# Patient Record
Sex: Female | Born: 1970
Health system: Southern US, Community
[De-identification: ages and names within clinical notes are randomized; demographics above are authoritative.]

## PROBLEM LIST (undated history)

## (undated) DIAGNOSIS — K649 Unspecified hemorrhoids: Secondary | ICD-10-CM

## (undated) DIAGNOSIS — Z5189 Encounter for other specified aftercare: Secondary | ICD-10-CM

## (undated) DIAGNOSIS — E7800 Pure hypercholesterolemia, unspecified: Secondary | ICD-10-CM

## (undated) DIAGNOSIS — F419 Anxiety disorder, unspecified: Secondary | ICD-10-CM

## (undated) DIAGNOSIS — D649 Anemia, unspecified: Secondary | ICD-10-CM

## (undated) HISTORY — DX: Unspecified hemorrhoids: K64.9

## (undated) HISTORY — DX: Pure hypercholesterolemia, unspecified: E78.00

## (undated) HISTORY — DX: Anemia, unspecified: D64.9

## (undated) HISTORY — DX: Anxiety disorder, unspecified: F41.9

## (undated) HISTORY — PX: OTHER SURGICAL HISTORY: SHX169

## (undated) HISTORY — PX: ABDOMINAL HYSTERECTOMY: SHX81

---

## 2010-11-24 ENCOUNTER — Inpatient Hospital Stay (HOSPITAL_COMMUNITY)
Admission: EM | Admit: 2010-11-24 | Discharge: 2010-11-26 | Payer: Self-pay | Source: Home / Self Care | Attending: Internal Medicine | Admitting: Internal Medicine

## 2010-11-25 ENCOUNTER — Encounter: Payer: Self-pay | Admitting: Internal Medicine

## 2010-11-28 LAB — PREGNANCY, URINE: Preg Test, Ur: NEGATIVE

## 2010-11-28 LAB — DIFFERENTIAL
Basophils Absolute: 0.1 10*3/uL (ref 0.0–0.1)
Basophils Relative: 2 % — ABNORMAL HIGH (ref 0–1)
Eosinophils Absolute: 0 10*3/uL (ref 0.0–0.7)
Lymphocytes Relative: 28 % (ref 12–46)
Neutrophils Relative %: 64 % (ref 43–77)

## 2010-11-28 LAB — CBC
HCT: 27.4 % — ABNORMAL LOW (ref 36.0–46.0)
HCT: 31.2 % — ABNORMAL LOW (ref 36.0–46.0)
Hemoglobin: 5.6 g/dL — CL (ref 12.0–15.0)
Hemoglobin: 6.4 g/dL — CL (ref 12.0–15.0)
Hemoglobin: 9.2 g/dL — ABNORMAL LOW (ref 12.0–15.0)
MCH: 16.5 pg — ABNORMAL LOW (ref 26.0–34.0)
MCH: 16.3 pg — ABNORMAL LOW (ref 26.0–34.0)
MCH: 20.4 pg — ABNORMAL LOW (ref 26.0–34.0)
MCV: 67.3 fL — ABNORMAL LOW (ref 78.0–100.0)
MCV: 69 fL — ABNORMAL LOW (ref 78.0–100.0)
Platelets: 129 10*3/uL — ABNORMAL LOW (ref 150–400)
Platelets: 126 10*3/uL — ABNORMAL LOW (ref 150–400)
RBC: 3.4 MIL/uL — ABNORMAL LOW (ref 3.87–5.11)
RBC: 3.92 MIL/uL (ref 3.87–5.11)
RBC: 4.52 MIL/uL (ref 3.87–5.11)
RDW: 25.2 % — ABNORMAL HIGH (ref 11.5–15.5)
WBC: 7.1 10*3/uL (ref 4.0–10.5)
WBC: 7.3 10*3/uL (ref 4.0–10.5)
WBC: 6.2 10*3/uL (ref 4.0–10.5)

## 2010-11-28 LAB — COMPREHENSIVE METABOLIC PANEL
ALT: 9 U/L (ref 0–35)
AST: 16 U/L (ref 0–37)
Albumin: 4.4 g/dL (ref 3.5–5.2)
Alkaline Phosphatase: 53 U/L (ref 39–117)
BUN: 4 mg/dL — ABNORMAL LOW (ref 6–23)
Chloride: 108 mEq/L (ref 96–112)
Potassium: 3.6 mEq/L (ref 3.5–5.1)
Sodium: 138 mEq/L (ref 135–145)
Total Bilirubin: 0.8 mg/dL (ref 0.3–1.2)
Total Protein: 7.2 g/dL (ref 6.0–8.3)

## 2010-11-28 LAB — URINALYSIS, MICROSCOPIC ONLY
Bilirubin Urine: NEGATIVE
Hgb urine dipstick: NEGATIVE
Ketones, ur: NEGATIVE mg/dL
Nitrite: NEGATIVE
Protein, ur: NEGATIVE mg/dL
Specific Gravity, Urine: 1.007 (ref 1.005–1.030)
Urobilinogen, UA: 0.2 mg/dL (ref 0.0–1.0)

## 2010-11-28 LAB — FERRITIN: Ferritin: 2 ng/mL — ABNORMAL LOW (ref 10–291)

## 2010-11-28 LAB — BASIC METABOLIC PANEL
BUN: <1 mg/dL — ABNORMAL LOW (ref 6–23)
CO2: 21 mEq/L (ref 19–32)
CO2: 18 mEq/L — ABNORMAL LOW (ref 19–32)
Chloride: 110 mEq/L (ref 96–112)
Chloride: 110 mEq/L (ref 96–112)
Creatinine, Ser: 0.57 mg/dL (ref 0.4–1.2)
GFR calc Af Amer: >60 mL/min (ref >60)
Glucose, Bld: 84 mg/dL (ref 70–99)
Potassium: 3.6 mEq/L (ref 3.5–5.1)
Sodium: 138 mEq/L (ref 135–145)

## 2010-11-28 LAB — HEMOGLOBIN AND HEMATOCRIT, BLOOD
HCT: 22 % — ABNORMAL LOW (ref 36.0–46.0)
Hemoglobin: 5.7 g/dL — CL (ref 12.0–15.0)

## 2010-11-28 LAB — POCT I-STAT, CHEM 8
Chloride: 109 mEq/L (ref 96–112)
Glucose, Bld: 85 mg/dL (ref 70–99)
HCT: 26 % — ABNORMAL LOW (ref 36.0–46.0)
Potassium: 4.1 mEq/L (ref 3.5–5.1)
Sodium: 140 mEq/L (ref 135–145)

## 2010-11-28 LAB — TSH: TSH: 1.923 u[IU]/mL (ref 0.350–4.500)

## 2010-11-28 LAB — CK TOTAL AND CKMB (NOT AT ARMC)
CK, MB: 0.8 ng/mL (ref 0.3–4.0)
Relative Index: INVALID (ref 0.0–2.5)

## 2010-11-28 LAB — FOLATE: Folate: 19.4 ng/mL

## 2010-11-28 LAB — SAVE SMEAR

## 2010-11-28 LAB — OCCULT BLOOD, POC DEVICE: Fecal Occult Bld: POSITIVE

## 2010-11-28 LAB — TROPONIN I: Troponin I: 0.03 ng/mL (ref 0.00–0.06)

## 2010-11-28 LAB — IRON AND TIBC: UIBC: 390 ug/dL

## 2010-11-28 LAB — POCT CARDIAC MARKERS: CKMB, poc: <1 ng/mL — ABNORMAL LOW (ref 1.0–8.0)

## 2010-11-28 LAB — VITAMIN B12: Vitamin B-12: 453 pg/mL (ref 211–911)

## 2010-11-28 LAB — APTT: aPTT: 30 seconds (ref 24–37)

## 2010-11-29 LAB — CROSSMATCH
ABO/RH(D): A POS
Antibody Screen: NEGATIVE
Unit division: 0
Unit division: 0

## 2010-11-29 LAB — BASIC METABOLIC PANEL
BUN: 4 mg/dL — ABNORMAL LOW (ref 6–23)
CO2: 21 mEq/L (ref 19–32)
Calcium: 9 mg/dL (ref 8.4–10.5)
Creatinine, Ser: 0.59 mg/dL (ref 0.4–1.2)
Glucose, Bld: 81 mg/dL (ref 70–99)

## 2010-11-29 LAB — CBC
MCH: 20.1 pg — ABNORMAL LOW (ref 26.0–34.0)
MCHC: 28.6 g/dL — ABNORMAL LOW (ref 30.0–36.0)
MCHC: 29.1 g/dL — ABNORMAL LOW (ref 30.0–36.0)
MCV: 70.2 fL — ABNORMAL LOW (ref 78.0–100.0)
Platelets: 114 10*3/uL — ABNORMAL LOW (ref 150–400)
RDW: 26.5 % — ABNORMAL HIGH (ref 11.5–15.5)
WBC: 8.2 10*3/uL (ref 4.0–10.5)
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
## 2010-12-08 NOTE — Procedures (Signed)
Summary: Upper Endoscopy  Patient: [name] Note: All result statuses are Final unless otherwise noted.  Tests: (1) Upper Endoscopy (EGD)   EGD Upper Endoscopy       DONE     [facility]     [address]     [place], [place]  [address]           ENDOSCOPY PROCEDURE REPORT           PATIENT:  [name], [name]  MR#:  [id number]     BIRTHDATE:  1970-12-08, 39 yrs. old  GENDER:  female           ENDOSCOPIST:  Wilhemina Bonito. Eda Keys, MD     Referred by:  [facility],           PROCEDURE DATE:  11/25/2010     PROCEDURE:  EGD, diagnostic 43235     ASA CLASS:  Class I     INDICATIONS:  iron deficiency anemia           MEDICATIONS:   There was residual sedation effect present from     prior procedure., Fentanyl 25 mcg IV, Versed 3 mg IV     TOPICAL ANESTHETIC:  Cetacaine Spray           DESCRIPTION OF PROCEDURE:   After the risks benefits and     alternatives of the procedure were thoroughly explained, informed     consent was obtained.  The Pentax Gastroscope [id number] endoscope     was introduced through the mouth and advanced to the third portion     of the duodenum, without limitations.  The instrument was slowly     withdrawn as the mucosa was fully examined.     <<PROCEDUREIMAGES>>           A benign Schatzki's ring was found in the distal esophagus.     Otherwise normal esophagus.  The stomach was entered and closely     examined. The antrum, angularis, and lesser curvature were well     visualized, including a retroflexed view of the cardia and fundus.     The stomach wall was normally distensable. The scope passed easily     through the pylorus into the duodenum.  The duodenal bulb was     normal in appearance, as was the postbulbar duodenum.     Retroflexed views revealed no abnormalities.    The scope was then     withdrawn from the patient and the procedure completed.           COMPLICATIONS:  None           ENDOSCOPIC IMPRESSION:     1)  Schatzki's ring     2) Otherwise normal esophagus     3) Normal stomach     4) Normal duodenum     5) No GI cause for anemia found. Suspect chronic losses from     menstration           RECOMMENDATIONS:     1) Return to the care of the hospitalists     2) Recommend oral iron therapy chronically. You should arrange     follow up for her so that her blood counts can be monitored.     3) GI WILL SIGN OFF     4) discussed with husband and reports given           ______________________________     Wilhemina Bonito. Eda Keys,  MD           CC:  Ramiro Harvest, MD;  The Patient           n.     eSIGNED:   Wilhemina Bonito. Eda Keys at 11/25/2010 01:44 PM           Nira Retort, [id number]  Note: An exclamation mark (!) indicates a result that was not dispersed into the flowsheet. Document Creation Date: 11/25/2010 1:44 PM _______________________________________________________________________  (1) Order result status: Final Collection or observation date-time: 11/25/2010 13:25 Requested date-time:  Receipt date-time:  Reported date-time:  Referring Physician:   Ordering Physician: Fransico Setters [phone]) Specimen Source:  Source: [name] Order Number: [id number] Lab site:

## 2010-12-08 NOTE — Procedures (Signed)
Summary: Colonoscopy  Patient: [name] Note: All result statuses are Final unless otherwise noted.  Tests: (1) Colonoscopy (COL)   COL Colonoscopy           DONE     [facility]     [address]     [place], [place]  [address]           COLONOSCOPY PROCEDURE REPORT           PATIENT:  [name], [name]  MR#:  [id number]     BIRTHDATE:  May 04, 1971, 39 yrs. old  GENDER:  female     ENDOSCOPIST:  Wilhemina Bonito. Eda Keys, MD     REF. BY:  [facility],     PROCEDURE DATE:  11/25/2010     PROCEDURE:  Diagnostic Colonoscopy     ASA CLASS:  Class I     INDICATIONS:  Iron deficiency anemia, rectal bleeding     MEDICATIONS:   Fentanyl 125 mcg IV, Versed 12 mg, Benadryl 50 mg     IV           DESCRIPTION OF PROCEDURE:   After the risks benefits and     alternatives of the procedure were thoroughly explained, informed     consent was obtained.  Digital rectal exam was performed and     revealed an inflammed prolasped hemorrhoid.   The Pentax     Colonoscope [id number] endoscope was introduced through the anus and     advanced to the cecum, which was identified by both the appendix     and ileocecal valve, without limitations.  The quality of the prep     was excellent, using MoviPrep.  The instrument was then slowly     withdrawn as the colon was fully examined.     <<PROCEDUREIMAGES>>           FINDINGS:  The terminal ileum appeared normal.  A normal appearing     cecum, ileocecal valve, and appendiceal orifice were identified.     The ascending, hepatic flexure, transverse, splenic flexure,     descending, sigmoid colon, and rectum appeared unremarkable.     Retroflexed views in the rectum revealed internal hemorrhoids.     The scope was then withdrawn from the patient and the procedure     completed.           COMPLICATIONS:  None     ENDOSCOPIC IMPRESSION:     1) Normal terminal ileum     2) Normal colon     3) Internal hemorrhoids     RECOMMENDATIONS:   1) You can prescribe Anusol HC supp. and sitz baths for     hemorrhoids     2) You can refer to generl surgeon if hemorrhoid does not respond     to medical therapy     3) Upper endoscopy will be performed today to further evaluate     anemia     ______________________________     Wilhemina Bonito. Eda Keys, MD           CC:  Ramiro Harvest, MD;  The Patient           n.     eSIGNED:   Wilhemina Bonito. Eda Keys at 11/25/2010 01:20 PM           Nira Retort, [id number]  Note: An exclamation mark (!) indicates a result that was not dispersed into the flowsheet. Document Creation Date: 11/25/2010 5:19  PM _______________________________________________________________________  (1) Order result status: Final Collection or observation date-time: 11/25/2010 13:10 Requested date-time:  Receipt date-time:  Reported date-time:  Referring Physician:   Ordering Physician: Fransico Setters [phone]) Specimen Source:  Source: [name] Order Number: [id number] Lab site:

## 2011-01-11 ENCOUNTER — Ambulatory Visit (INDEPENDENT_AMBULATORY_CARE_PROVIDER_SITE_OTHER): Payer: Medicare Other | Admitting: Psychiatry

## 2011-01-11 DIAGNOSIS — F4310 Post-traumatic stress disorder, unspecified: Secondary | ICD-10-CM

## 2011-01-25 ENCOUNTER — Ambulatory Visit (INDEPENDENT_AMBULATORY_CARE_PROVIDER_SITE_OTHER): Payer: Medicare Other | Admitting: Psychiatry

## 2011-01-25 DIAGNOSIS — F4310 Post-traumatic stress disorder, unspecified: Secondary | ICD-10-CM

## 2011-02-01 ENCOUNTER — Ambulatory Visit: Payer: Medicare Other | Admitting: Psychiatry

## 2011-02-08 ENCOUNTER — Ambulatory Visit: Payer: Medicare Other | Admitting: Psychiatry

## 2011-03-08 ENCOUNTER — Ambulatory Visit (INDEPENDENT_AMBULATORY_CARE_PROVIDER_SITE_OTHER): Payer: Medicare Other | Admitting: Psychiatry

## 2011-03-08 DIAGNOSIS — F4310 Post-traumatic stress disorder, unspecified: Secondary | ICD-10-CM

## 2011-03-15 ENCOUNTER — Ambulatory Visit: Payer: Medicare Other | Admitting: Psychiatry

## 2011-05-09 ENCOUNTER — Ambulatory Visit (INDEPENDENT_AMBULATORY_CARE_PROVIDER_SITE_OTHER): Payer: Medicare Other | Admitting: Psychiatry

## 2011-05-09 DIAGNOSIS — F4310 Post-traumatic stress disorder, unspecified: Secondary | ICD-10-CM

## 2011-07-26 ENCOUNTER — Encounter (INDEPENDENT_AMBULATORY_CARE_PROVIDER_SITE_OTHER): Payer: Self-pay | Admitting: Surgery

## 2011-08-04 ENCOUNTER — Ambulatory Visit (INDEPENDENT_AMBULATORY_CARE_PROVIDER_SITE_OTHER): Payer: Medicare Other | Admitting: Licensed Clinical Social Worker

## 2011-08-04 DIAGNOSIS — F411 Generalized anxiety disorder: Secondary | ICD-10-CM

## 2011-08-09 ENCOUNTER — Ambulatory Visit (INDEPENDENT_AMBULATORY_CARE_PROVIDER_SITE_OTHER): Payer: Medicare Other | Admitting: Surgery

## 2011-08-09 ENCOUNTER — Encounter (INDEPENDENT_AMBULATORY_CARE_PROVIDER_SITE_OTHER): Payer: Self-pay | Admitting: Surgery

## 2011-08-09 VITALS — BP 128/78 | HR 60 | Temp 97.4°F | Resp 16 | Ht 68.0 in | Wt 128.2 lb

## 2011-08-09 DIAGNOSIS — K648 Other hemorrhoids: Secondary | ICD-10-CM

## 2011-08-09 MED ORDER — HYDROCORTISONE ACETATE 25 MG RE SUPP: 25.0000 mg | Freq: Two times a day (BID) | RECTAL | Status: AC
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
## 2011-08-09 NOTE — Patient Instructions (Signed)
Sitz baths after bowel movements - reduce hemorrhoids if prolapsed.  Use suppositories as directed.  Follow-up 3-4 weeks.

## 2011-08-22 ENCOUNTER — Encounter (INDEPENDENT_AMBULATORY_CARE_PROVIDER_SITE_OTHER): Payer: Self-pay | Admitting: Surgery

## 2011-08-25 ENCOUNTER — Encounter (INDEPENDENT_AMBULATORY_CARE_PROVIDER_SITE_OTHER): Payer: Self-pay | Admitting: Surgery

## 2011-08-30 ENCOUNTER — Ambulatory Visit (INDEPENDENT_AMBULATORY_CARE_PROVIDER_SITE_OTHER): Payer: Medicare Other | Admitting: Licensed Clinical Social Worker

## 2011-08-30 DIAGNOSIS — F411 Generalized anxiety disorder: Secondary | ICD-10-CM

## 2011-08-31 ENCOUNTER — Encounter (INDEPENDENT_AMBULATORY_CARE_PROVIDER_SITE_OTHER): Payer: Self-pay | Admitting: Surgery

## 2011-09-06 ENCOUNTER — Ambulatory Visit (INDEPENDENT_AMBULATORY_CARE_PROVIDER_SITE_OTHER): Payer: Medicare Other | Admitting: Licensed Clinical Social Worker

## 2011-09-06 ENCOUNTER — Ambulatory Visit (INDEPENDENT_AMBULATORY_CARE_PROVIDER_SITE_OTHER): Payer: Medicare Other | Admitting: Surgery

## 2011-09-06 ENCOUNTER — Encounter (INDEPENDENT_AMBULATORY_CARE_PROVIDER_SITE_OTHER): Payer: Self-pay | Admitting: Surgery

## 2011-09-06 VITALS — BP 118/90 | HR 60 | Temp 97.3°F | Resp 20 | Ht 67.0 in | Wt 127.4 lb

## 2011-09-06 DIAGNOSIS — K648 Other hemorrhoids: Secondary | ICD-10-CM

## 2011-09-06 DIAGNOSIS — F411 Generalized anxiety disorder: Secondary | ICD-10-CM

## 2011-09-06 MED ORDER — HYDROCORTISONE ACETATE 25 MG RE SUPP: 25.0000 mg | Freq: Two times a day (BID) | RECTAL | Status: AC

## 2011-09-06 NOTE — Patient Instructions (Signed)
Daily fiber supplement - metamucil or citracel Use Miralax as needed for constipation Use sitz baths and suppositories for flare-ups.
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
## 2011-09-13 ENCOUNTER — Ambulatory Visit (INDEPENDENT_AMBULATORY_CARE_PROVIDER_SITE_OTHER): Payer: Medicare Other | Admitting: Licensed Clinical Social Worker

## 2011-09-13 DIAGNOSIS — F411 Generalized anxiety disorder: Secondary | ICD-10-CM

## 2011-09-20 ENCOUNTER — Ambulatory Visit (INDEPENDENT_AMBULATORY_CARE_PROVIDER_SITE_OTHER): Payer: Medicare Other | Admitting: Licensed Clinical Social Worker

## 2011-09-20 DIAGNOSIS — F411 Generalized anxiety disorder: Secondary | ICD-10-CM

## 2011-10-04 ENCOUNTER — Ambulatory Visit: Payer: Medicare Other | Admitting: Licensed Clinical Social Worker

## 2011-10-09 ENCOUNTER — Encounter (INDEPENDENT_AMBULATORY_CARE_PROVIDER_SITE_OTHER): Payer: Medicare Other | Admitting: Surgery

## 2011-10-20 ENCOUNTER — Encounter (INDEPENDENT_AMBULATORY_CARE_PROVIDER_SITE_OTHER): Payer: Self-pay | Admitting: Surgery

## 2011-11-16 ENCOUNTER — Ambulatory Visit: Payer: Medicare Other | Admitting: Licensed Clinical Social Worker

## 2011-11-17 ENCOUNTER — Ambulatory Visit: Payer: Medicare Other | Admitting: Licensed Clinical Social Worker

## 2012-01-24 ENCOUNTER — Ambulatory Visit: Payer: Medicare Other | Admitting: Licensed Clinical Social Worker

## 2012-01-29 ENCOUNTER — Ambulatory Visit (INDEPENDENT_AMBULATORY_CARE_PROVIDER_SITE_OTHER): Payer: Medicare Other | Admitting: Psychiatry

## 2012-01-29 DIAGNOSIS — F4310 Post-traumatic stress disorder, unspecified: Secondary | ICD-10-CM

## 2012-02-12 ENCOUNTER — Ambulatory Visit (INDEPENDENT_AMBULATORY_CARE_PROVIDER_SITE_OTHER): Payer: Medicare Other | Admitting: Psychiatry

## 2012-02-12 DIAGNOSIS — F411 Generalized anxiety disorder: Secondary | ICD-10-CM

## 2012-02-26 ENCOUNTER — Ambulatory Visit (INDEPENDENT_AMBULATORY_CARE_PROVIDER_SITE_OTHER): Payer: Medicare Other | Admitting: Psychiatry

## 2012-02-26 DIAGNOSIS — F411 Generalized anxiety disorder: Secondary | ICD-10-CM

## 2012-03-11 ENCOUNTER — Ambulatory Visit (INDEPENDENT_AMBULATORY_CARE_PROVIDER_SITE_OTHER): Payer: Medicare Other | Admitting: Psychiatry

## 2012-03-11 DIAGNOSIS — F411 Generalized anxiety disorder: Secondary | ICD-10-CM

## 2012-04-08 ENCOUNTER — Ambulatory Visit (INDEPENDENT_AMBULATORY_CARE_PROVIDER_SITE_OTHER): Payer: Medicare Other | Admitting: Psychiatry

## 2012-04-08 DIAGNOSIS — F411 Generalized anxiety disorder: Secondary | ICD-10-CM

## 2012-04-22 ENCOUNTER — Ambulatory Visit (INDEPENDENT_AMBULATORY_CARE_PROVIDER_SITE_OTHER): Payer: Medicare Other | Admitting: Psychiatry

## 2012-04-22 DIAGNOSIS — F411 Generalized anxiety disorder: Secondary | ICD-10-CM

## 2012-05-13 ENCOUNTER — Ambulatory Visit: Payer: Medicare Other | Admitting: Psychiatry

## 2013-01-31 ENCOUNTER — Encounter (HOSPITAL_COMMUNITY): Payer: Self-pay | Admitting: *Deleted

## 2013-01-31 ENCOUNTER — Emergency Department (HOSPITAL_COMMUNITY)
Admission: EM | Admit: 2013-01-31 | Discharge: 2013-01-31 | Disposition: A | Discharge status: Home / Self Care | Payer: Medicare Other | Attending: Emergency Medicine | Admitting: Emergency Medicine

## 2013-01-31 DIAGNOSIS — Z862 Personal history of diseases of the blood and blood-forming organs and certain disorders involving the immune mechanism: Secondary | ICD-10-CM | POA: Insufficient documentation

## 2013-01-31 DIAGNOSIS — K0889 Other specified disorders of teeth and supporting structures: Secondary | ICD-10-CM

## 2013-01-31 DIAGNOSIS — Z8659 Personal history of other mental and behavioral disorders: Secondary | ICD-10-CM | POA: Insufficient documentation

## 2013-01-31 DIAGNOSIS — Z87828 Personal history of other (healed) physical injury and trauma: Secondary | ICD-10-CM | POA: Insufficient documentation

## 2013-01-31 DIAGNOSIS — F17200 Nicotine dependence, unspecified, uncomplicated: Secondary | ICD-10-CM | POA: Insufficient documentation

## 2013-01-31 DIAGNOSIS — K089 Disorder of teeth and supporting structures, unspecified: Secondary | ICD-10-CM | POA: Insufficient documentation

## 2013-01-31 DIAGNOSIS — R220 Localized swelling, mass and lump, head: Secondary | ICD-10-CM | POA: Insufficient documentation

## 2013-01-31 DIAGNOSIS — Z8679 Personal history of other diseases of the circulatory system: Secondary | ICD-10-CM | POA: Insufficient documentation

## 2013-01-31 MED ORDER — HYDROCODONE-ACETAMINOPHEN 5-325 MG PO TABS: 1.0000 | ORAL_TABLET | Freq: Four times a day (QID) | ORAL | Status: DC | PRN

## 2013-01-31 MED ORDER — PENICILLIN V POTASSIUM 500 MG PO TABS: 500.0000 mg | ORAL_TABLET | Freq: Three times a day (TID) | ORAL | Status: DC
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
## 2013-01-31 NOTE — ED Notes (Signed)
Toothache for 2 days 

## 2013-02-01 NOTE — ED Provider Notes (Signed)
Medical screening examination/treatment/procedure(s) were performed by non-physician practitioner and as supervising physician I was immediately available for consultation/collaboration.  Derwood Kaplan, MD 02/01/13 2110

## 2015-02-16 ENCOUNTER — Emergency Department (HOSPITAL_COMMUNITY): Payer: Commercial Managed Care - HMO

## 2015-02-16 ENCOUNTER — Encounter (HOSPITAL_COMMUNITY): Payer: Self-pay | Admitting: *Deleted

## 2015-02-16 ENCOUNTER — Emergency Department (HOSPITAL_COMMUNITY)
Admission: EM | Admit: 2015-02-16 | Discharge: 2015-02-16 | Disposition: A | Discharge status: Home / Self Care | Payer: Commercial Managed Care - HMO | Attending: Emergency Medicine | Admitting: Emergency Medicine

## 2015-02-16 DIAGNOSIS — Z792 Long term (current) use of antibiotics: Secondary | ICD-10-CM | POA: Insufficient documentation

## 2015-02-16 DIAGNOSIS — Z720 Tobacco use: Secondary | ICD-10-CM | POA: Insufficient documentation

## 2015-02-16 DIAGNOSIS — Z862 Personal history of diseases of the blood and blood-forming organs and certain disorders involving the immune mechanism: Secondary | ICD-10-CM | POA: Insufficient documentation

## 2015-02-16 DIAGNOSIS — Z8659 Personal history of other mental and behavioral disorders: Secondary | ICD-10-CM | POA: Diagnosis not present

## 2015-02-16 DIAGNOSIS — M19012 Primary osteoarthritis, left shoulder: Secondary | ICD-10-CM | POA: Insufficient documentation

## 2015-02-16 DIAGNOSIS — Z8719 Personal history of other diseases of the digestive system: Secondary | ICD-10-CM | POA: Diagnosis not present

## 2015-02-16 DIAGNOSIS — M1990 Unspecified osteoarthritis, unspecified site: Secondary | ICD-10-CM | POA: Diagnosis not present

## 2015-02-16 DIAGNOSIS — M25512 Pain in left shoulder: Secondary | ICD-10-CM | POA: Diagnosis not present

## 2015-02-16 MED ORDER — ACETAMINOPHEN-CODEINE #3 300-30 MG PO TABS: 1.0000 | ORAL_TABLET | Freq: Four times a day (QID) | ORAL | Status: DC | PRN

## 2015-02-16 MED ORDER — OXYCODONE-ACETAMINOPHEN 5-325 MG PO TABS
1.0000 | ORAL_TABLET | Freq: Once | ORAL | Status: AC
  Administered 2015-02-16: 1 via ORAL
  Filled 2015-02-16: qty 1
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
## 2015-02-16 NOTE — Discharge Instructions (Signed)
Arthritis, Nonspecific °Arthritis is inflammation of a joint. This usually means pain, redness, warmth or swelling are present. One or more joints may be involved. There are a number of types of arthritis. Your caregiver may not be able to tell what type of arthritis you have right away. °CAUSES  °The most common cause of arthritis is the wear and tear on the joint (osteoarthritis). This causes damage to the cartilage, which can break down over time. The knees, hips, back and neck are most often affected by this type of arthritis. °Other types of arthritis and common causes of joint pain include: °· Sprains and other injuries near the joint. Sometimes minor sprains and injuries cause pain and swelling that develop hours later. °· Rheumatoid arthritis. This affects hands, feet and knees. It usually affects both sides of your body at the same time. It is often associated with chronic ailments, fever, weight loss and general weakness. °· Crystal arthritis. Gout and pseudo gout can cause occasional acute severe pain, redness and swelling in the foot, ankle, or knee. °· Infectious arthritis. Bacteria can get into a joint through a break in overlying skin. This can cause infection of the joint. Bacteria and viruses can also spread through the blood and affect your joints. °· Drug, infectious and allergy reactions. Sometimes joints can become mildly painful and slightly swollen with these types of illnesses. °SYMPTOMS  °· Pain is the main symptom. °· Your joint or joints can also be red, swollen and warm or hot to the touch. °· You may have a fever with certain types of arthritis, or even feel overall ill. °· The joint with arthritis will hurt with movement. Stiffness is present with some types of arthritis. °DIAGNOSIS  °Your caregiver will suspect arthritis based on your description of your symptoms and on your exam. Testing may be needed to find the type of arthritis: °· Blood and sometimes urine tests. °· X-ray tests  and sometimes CT or MRI scans. °· Removal of fluid from the joint (arthrocentesis) is done to check for bacteria, crystals or other causes. Your caregiver (or a specialist) will numb the area over the joint with a local anesthetic, and use a needle to remove joint fluid for examination. This procedure is only minimally uncomfortable. °· Even with these tests, your caregiver may not be able to tell what kind of arthritis you have. Consultation with a specialist (rheumatologist) may be helpful. °TREATMENT  °Your caregiver will discuss with you treatment specific to your type of arthritis. If the specific type cannot be determined, then the following general recommendations may apply. °Treatment of severe joint pain includes: °· Rest. °· Elevation. °· Anti-inflammatory medication (for example, ibuprofen) may be prescribed. Avoiding activities that cause increased pain. °· Only take over-the-counter or prescription medicines for pain and discomfort as recommended by your caregiver. °· Cold packs over an inflamed joint may be used for 10 to 15 minutes every hour. Hot packs sometimes feel better, but do not use overnight. Do not use hot packs if you are diabetic without your caregiver's permission. °· A cortisone shot into arthritic joints may help reduce pain and swelling. °· Any acute arthritis that gets worse over the next 1 to 2 days needs to be looked at to be sure there is no joint infection. °Long-term arthritis treatment involves modifying activities and lifestyle to reduce joint stress jarring. This can include weight loss. Also, exercise is needed to nourish the joint cartilage and remove waste. This helps keep the muscles   around the joint strong. °HOME CARE INSTRUCTIONS  °· Do not take aspirin to relieve pain if gout is suspected. This elevates uric acid levels. °· Only take over-the-counter or prescription medicines for pain, discomfort or fever as directed by your caregiver. °· Rest the joint as much as  possible. °· If your joint is swollen, keep it elevated. °· Use crutches if the painful joint is in your leg. °· Drinking plenty of fluids may help for certain types of arthritis. °· Follow your caregiver's dietary instructions. °· Try low-impact exercise such as: °¨ Swimming. °¨ Water aerobics. °¨ Biking. °¨ Walking. °· Morning stiffness is often relieved by a warm shower. °· Put your joints through regular range-of-motion. °SEEK MEDICAL CARE IF:  °· You do not feel better in 24 hours or are getting worse. °· You have side effects to medications, or are not getting better with treatment. °SEEK IMMEDIATE MEDICAL CARE IF:  °· You have a fever. °· You develop severe joint pain, swelling or redness. °· Many joints are involved and become painful and swollen. °· There is severe back pain and/or leg weakness. °· You have loss of bowel or bladder control. °Document Released: 11/30/2004 Document Revised: 01/15/2012 Document Reviewed: 12/16/2008 °Exit[employer]® Patient Information ©2015 [employer]. This information is not intended to replace advice given to you by your health care provider. Make sure you discuss any questions you have with your health care provider. ° °
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
## 2015-02-18 ENCOUNTER — Ambulatory Visit (INDEPENDENT_AMBULATORY_CARE_PROVIDER_SITE_OTHER): Payer: Commercial Managed Care - HMO | Admitting: Physician Assistant

## 2015-02-18 VITALS — BP 98/68 | HR 88 | Temp 98.3°F | Resp 18 | Ht 68.0 in | Wt 113.0 lb

## 2015-02-18 DIAGNOSIS — Z87828 Personal history of other (healed) physical injury and trauma: Secondary | ICD-10-CM | POA: Diagnosis not present

## 2015-02-18 DIAGNOSIS — M129 Arthropathy, unspecified: Secondary | ICD-10-CM | POA: Diagnosis not present

## 2015-02-18 DIAGNOSIS — M62830 Muscle spasm of back: Secondary | ICD-10-CM

## 2015-02-18 DIAGNOSIS — Z720 Tobacco use: Secondary | ICD-10-CM | POA: Diagnosis not present

## 2015-02-18 DIAGNOSIS — M19019 Primary osteoarthritis, unspecified shoulder: Secondary | ICD-10-CM | POA: Insufficient documentation

## 2015-02-18 MED ORDER — MELOXICAM 15 MG PO TABS: 15.0000 mg | ORAL_TABLET | Freq: Every day | ORAL | Status: DC

## 2015-02-18 MED ORDER — CYCLOBENZAPRINE HCL 5 MG PO TABS: 5.0000 mg | ORAL_TABLET | Freq: Every day | ORAL | Status: DC

## 2015-02-18 NOTE — Patient Instructions (Addendum)
Take mobic once a day. You may take tylenol #3 OR flexeril to help you sleep at night. Ice alternating with heat can help. You will get a phone call to make appointment with ortho. Go to smoking cessation class on Monday. Return with further concerns.
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
## 2015-02-19 ENCOUNTER — Telehealth: Payer: Self-pay | Admitting: Physician Assistant

## 2015-02-19 NOTE — Telephone Encounter (Signed)
Patient's son dropped off disability paperwork for his mother. She was seen on 02/18/2015 by Bennett Scrape. Date of request section on form says 02/15/2013. Maybe this is a typo, will call patient to verify. Son states that his mother will pay for the forms when she comes to the office pick them up. We will hold patient to this. Lauren or myself can collect payment when patient presents to pick up forms. Blank copy needs to be scanned to chart and we will call patient to see what her need for disability is.

## 2015-02-25 NOTE — Telephone Encounter (Signed)
Pt needs extended testing time d/t shoulder pain and PTSD. Filled out disability paperwork for [employer]. Please call pt and let her know forms are ready for pick-up.

## 2015-02-25 NOTE — Telephone Encounter (Signed)
Pt notified. She will come pick up ppw.

## 2015-03-05 DIAGNOSIS — M25512 Pain in left shoulder: Secondary | ICD-10-CM | POA: Diagnosis not present

## 2015-03-05 DIAGNOSIS — M542 Cervicalgia: Secondary | ICD-10-CM | POA: Diagnosis not present

## 2015-03-08 DIAGNOSIS — M25512 Pain in left shoulder: Secondary | ICD-10-CM | POA: Diagnosis not present

## 2015-06-30 DIAGNOSIS — Z124 Encounter for screening for malignant neoplasm of cervix: Secondary | ICD-10-CM | POA: Diagnosis not present

## 2015-06-30 DIAGNOSIS — Z01419 Encounter for gynecological examination (general) (routine) without abnormal findings: Secondary | ICD-10-CM | POA: Diagnosis not present

## 2015-06-30 DIAGNOSIS — E780 Pure hypercholesterolemia: Secondary | ICD-10-CM | POA: Diagnosis not present

## 2015-06-30 DIAGNOSIS — R5382 Chronic fatigue, unspecified: Secondary | ICD-10-CM | POA: Diagnosis not present

## 2015-06-30 DIAGNOSIS — Z1231 Encounter for screening mammogram for malignant neoplasm of breast: Secondary | ICD-10-CM | POA: Diagnosis not present

## 2015-06-30 DIAGNOSIS — R5383 Other fatigue: Secondary | ICD-10-CM | POA: Diagnosis not present

## 2015-06-30 DIAGNOSIS — N926 Irregular menstruation, unspecified: Secondary | ICD-10-CM | POA: Diagnosis not present

## 2015-07-01 ENCOUNTER — Emergency Department (HOSPITAL_COMMUNITY)
Admission: EM | Admit: 2015-07-01 | Discharge: 2015-07-01 | Disposition: A | Discharge status: Home / Self Care | Payer: Commercial Managed Care - HMO | Attending: Emergency Medicine | Admitting: Emergency Medicine

## 2015-07-01 ENCOUNTER — Encounter (HOSPITAL_COMMUNITY): Payer: Self-pay | Admitting: Family Medicine

## 2015-07-01 DIAGNOSIS — D509 Iron deficiency anemia, unspecified: Secondary | ICD-10-CM | POA: Diagnosis not present

## 2015-07-01 DIAGNOSIS — Z720 Tobacco use: Secondary | ICD-10-CM | POA: Insufficient documentation

## 2015-07-01 DIAGNOSIS — N938 Other specified abnormal uterine and vaginal bleeding: Secondary | ICD-10-CM | POA: Diagnosis not present

## 2015-07-01 DIAGNOSIS — F419 Anxiety disorder, unspecified: Secondary | ICD-10-CM | POA: Insufficient documentation

## 2015-07-01 DIAGNOSIS — Z79899 Other long term (current) drug therapy: Secondary | ICD-10-CM | POA: Diagnosis not present

## 2015-07-01 DIAGNOSIS — R710 Precipitous drop in hematocrit: Secondary | ICD-10-CM | POA: Diagnosis present

## 2015-07-01 LAB — COMPREHENSIVE METABOLIC PANEL
ALT: 9 U/L — AB (ref 14–54)
ANION GAP: 8 (ref 5–15)
AST: 18 U/L (ref 15–41)
Albumin: 4.3 g/dL (ref 3.5–5.0)
Alkaline Phosphatase: 56 U/L (ref 38–126)
BUN: 6 mg/dL (ref 6–20)
CHLORIDE: 107 mmol/L (ref 101–111)
CO2: 23 mmol/L (ref 22–32)
CREATININE: 0.57 mg/dL (ref 0.44–1.00)
Calcium: 9.4 mg/dL (ref 8.9–10.3)
Glucose, Bld: 86 mg/dL (ref 65–99)
Potassium: 3.8 mmol/L (ref 3.5–5.1)
SODIUM: 138 mmol/L (ref 135–145)
Total Bilirubin: 0.7 mg/dL (ref 0.3–1.2)
Total Protein: 7.7 g/dL (ref 6.5–8.1)

## 2015-07-01 LAB — WET PREP, GENITAL
CLUE CELLS WET PREP: NONE SEEN
Trich, Wet Prep: NONE SEEN
YEAST WET PREP: NONE SEEN

## 2015-07-01 LAB — CBC
HCT: 24.7 % — ABNORMAL LOW (ref 36.0–46.0)
HEMOGLOBIN: 6.3 g/dL — AB (ref 12.0–15.0)
MCH: 16 pg — AB (ref 26.0–34.0)
MCHC: 25.5 g/dL — ABNORMAL LOW (ref 30.0–36.0)
MCV: 62.7 fL — AB (ref 78.0–100.0)
PLATELETS: 247 10*3/uL (ref 150–400)
RBC: 3.94 MIL/uL (ref 3.87–5.11)
RDW: 25.4 % — ABNORMAL HIGH (ref 11.5–15.5)
WBC: 4.8 10*3/uL (ref 4.0–10.5)

## 2015-07-01 LAB — PREPARE RBC (CROSSMATCH)

## 2015-07-01 MED ORDER — POTASSIUM CHLORIDE CRYS ER 20 MEQ PO TBCR: 40.0000 meq | EXTENDED_RELEASE_TABLET | Freq: Once | ORAL | Status: DC

## 2015-07-01 MED ORDER — FERROUS SULFATE 325 (65 FE) MG PO TABS: 325.0000 mg | ORAL_TABLET | Freq: Two times a day (BID) | ORAL | Status: DC

## 2015-07-01 MED ORDER — POTASSIUM CHLORIDE 10 MEQ/100ML IV SOLN: 10.0000 meq | INTRAVENOUS | Status: DC

## 2015-07-01 MED ORDER — SODIUM CHLORIDE 0.9 % IV SOLN
Freq: Once | INTRAVENOUS | Status: AC
  Administered 2015-07-01: 14:00:00 via INTRAVENOUS
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
## 2015-07-01 NOTE — ED Provider Notes (Signed)
   Patient signed out to me at shift change by Carlisle Cater PA-C at time of shift change pending blood transfusion. Patient was brought in with abnormal lab results with hemoglobin of 6.3. She received 2 units of blood here in the ED, no problems with transfusion. Patient reports that she was feeling well. She'll be discharged home with strict return precautions and directed to follow-up with PCP tomorrow for further evaluation and management.  She verbalized understanding and agreement to today's plan.   Okey Regal, PA-C 07/01/15 1725  Lacretia Leigh, MD 07/01/15 [phone]

## 2015-07-01 NOTE — ED Notes (Signed)
Pelvic cart at bedside. 

## 2015-07-01 NOTE — ED Notes (Signed)
Pt denies symptoms other than fatigue, a x 4, NAD. VSS.

## 2015-07-01 NOTE — ED Notes (Signed)
Pt sent here for blood transfusion sts that her hgb is 6.7. Denies any symptoms.

## 2015-07-01 NOTE — Discharge Instructions (Signed)
Please read and follow all provided instructions.  Your diagnoses today include:  1. Microcytic anemia     Tests performed today include:  Blood counts and electrolytes - low hemoglobin (6.3)  Vaginal swabs - no infections  Vital signs. See below for your results today.   Medications prescribed:   Iron supplement   Take any prescribed medications only as directed.  Home care instructions:  Follow any educational materials contained in this packet.  BE VERY CAREFUL not to take multiple medicines containing Tylenol (also called acetaminophen). Doing so can lead to an overdose which can damage your liver and cause liver failure and possibly death.   Follow-up instructions: Please follow-up with your primary care provider in the next 3 days for further evaluation of your symptoms.   Return instructions:   Please return to the Emergency Department if you experience worsening symptoms.   Return if you develop severe bleeding from your vagina, rectum or elsewhere  Please return if you have any other emergent concerns.  Additional Information:  Your vital signs today were: BP 115/87 mmHg   Pulse 74   Temp(Src) 98.8 F (37.1 C) (Oral)   Resp 13   Ht 5\' 7"  (1.702 m)   Wt 120 lb (54.432 kg)   BMI 18.79 kg/m2   SpO2 100%   LMP 07/01/2015 If your blood pressure (BP) was elevated above 135/85 this visit, please have this repeated by your doctor within one month. --------------

## 2015-07-01 NOTE — ED Notes (Signed)
Critical Hmg-6.3, PA Ge[place] aware.

## 2015-07-02 DIAGNOSIS — Z1231 Encounter for screening mammogram for malignant neoplasm of breast: Secondary | ICD-10-CM | POA: Diagnosis not present

## 2015-07-02 LAB — TYPE AND SCREEN
ABO/RH(D): A POS
Antibody Screen: NEGATIVE
UNIT DIVISION: 0
UNIT DIVISION: 0

## 2015-07-02 LAB — GC/CHLAMYDIA PROBE AMP (~~LOC~~) NOT AT ARMC
CHLAMYDIA, DNA PROBE: NEGATIVE
NEISSERIA GONORRHEA: NEGATIVE

## 2015-07-06 DIAGNOSIS — D500 Iron deficiency anemia secondary to blood loss (chronic): Secondary | ICD-10-CM | POA: Diagnosis not present

## 2015-07-06 DIAGNOSIS — R5383 Other fatigue: Secondary | ICD-10-CM | POA: Diagnosis not present

## 2015-07-07 DIAGNOSIS — D500 Iron deficiency anemia secondary to blood loss (chronic): Secondary | ICD-10-CM | POA: Diagnosis not present

## 2015-07-07 DIAGNOSIS — R5383 Other fatigue: Secondary | ICD-10-CM | POA: Diagnosis not present

## 2015-07-07 DIAGNOSIS — N6001 Solitary cyst of right breast: Secondary | ICD-10-CM | POA: Diagnosis not present

## 2015-07-10 ENCOUNTER — Encounter (HOSPITAL_COMMUNITY): Payer: Self-pay | Admitting: Emergency Medicine

## 2015-07-10 ENCOUNTER — Emergency Department (HOSPITAL_COMMUNITY)
Admission: EM | Admit: 2015-07-10 | Discharge: 2015-07-10 | Disposition: A | Discharge status: Home / Self Care | Payer: Commercial Managed Care - HMO | Attending: Emergency Medicine | Admitting: Emergency Medicine

## 2015-07-10 DIAGNOSIS — Z8659 Personal history of other mental and behavioral disorders: Secondary | ICD-10-CM | POA: Insufficient documentation

## 2015-07-10 DIAGNOSIS — D649 Anemia, unspecified: Secondary | ICD-10-CM | POA: Diagnosis not present

## 2015-07-10 DIAGNOSIS — N938 Other specified abnormal uterine and vaginal bleeding: Secondary | ICD-10-CM | POA: Diagnosis not present

## 2015-07-10 DIAGNOSIS — Z720 Tobacco use: Secondary | ICD-10-CM | POA: Insufficient documentation

## 2015-07-10 DIAGNOSIS — Z79899 Other long term (current) drug therapy: Secondary | ICD-10-CM | POA: Diagnosis not present

## 2015-07-10 DIAGNOSIS — Z3202 Encounter for pregnancy test, result negative: Secondary | ICD-10-CM | POA: Diagnosis not present

## 2015-07-10 LAB — CBC
HEMATOCRIT: 35.2 % — AB (ref 36.0–46.0)
HEMOGLOBIN: 9.6 g/dL — AB (ref 12.0–15.0)
MCH: 19.6 pg — ABNORMAL LOW (ref 26.0–34.0)
MCHC: 27.3 g/dL — ABNORMAL LOW (ref 30.0–36.0)
MCV: 72 fL — ABNORMAL LOW (ref 78.0–100.0)
Platelets: 172 10*3/uL (ref 150–400)
RBC: 4.89 MIL/uL (ref 3.87–5.11)
RDW: 30.1 % — ABNORMAL HIGH (ref 11.5–15.5)
WBC: 9.3 10*3/uL (ref 4.0–10.5)

## 2015-07-10 LAB — I-STAT BETA HCG BLOOD, ED (MC, WL, AP ONLY)

## 2015-07-10 NOTE — ED Notes (Signed)
Pt reports she was seen here 1 week ago for heavy vaginal bleeding. Reports bleeding began again on 9/1 and has gotten progressively heavier, pt c/o abdominal cramping. Reports she has soaked through a sanitary pad overnight and she was told to return if heavy bleeding continued. Pt ambulatory to room. Pt denies dizziness or lightheadedness but reports generalized weakness.

## 2015-07-10 NOTE — Discharge Instructions (Signed)
Abnormal Uterine Bleeding Take the medication as prescribed. The women's health clinic call you in 3 days to schedule the next available appointment. If you don't hear from them in 3 days, call to schedule the appointment. Tell office staff that Dr. Evelena Peat with Dr. Harolyn Rutherford about your case. Abnormal uterine bleeding can affect women at various stages in life, including teenagers, women in their reproductive years, pregnant women, and women who have reached menopause. Several kinds of uterine bleeding are considered abnormal, including:  Bleeding or spotting between periods.   Bleeding after sexual intercourse.   Bleeding that is heavier or more than normal.   Periods that last longer than usual.  Bleeding after menopause.  Many cases of abnormal uterine bleeding are minor and simple to treat, while others are more serious. Any type of abnormal bleeding should be evaluated by your health care provider. Treatment will depend on the cause of the bleeding. HOME CARE INSTRUCTIONS Monitor your condition for any changes. The following actions may help to alleviate any discomfort you are experiencing:  Avoid the use of tampons and douches as directed by your health care provider.  Change your pads frequently. You should get regular pelvic exams and Pap tests. Keep all follow-up appointments for diagnostic tests as directed by your health care provider.  SEEK MEDICAL CARE IF:   Your bleeding lasts more than 1 week.   You feel dizzy at times.  SEEK IMMEDIATE MEDICAL CARE IF:   You pass out.   You are changing pads every 15 to 30 minutes.   You have abdominal pain.  You have a fever.   You become sweaty or weak.   You are passing large blood clots from the vagina.   You start to feel nauseous and vomit. MAKE SURE YOU:   Understand these instructions.  Will watch your condition.  Will get help right away if you are not doing well or get worse. Document  Released: 10/23/2005 Document Revised: 10/28/2013 Document Reviewed: 05/22/2013 [facility] Patient Information 2015 [place], [place]. This information is not intended to replace advice given to you by your health care provider. Make sure you discuss any questions you have with your health care provider.
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
## 2015-07-15 ENCOUNTER — Other Ambulatory Visit (HOSPITAL_COMMUNITY)
Admission: RE | Admit: 2015-07-15 | Discharge: 2015-07-15 | Disposition: A | Discharge status: Home / Self Care | Payer: Commercial Managed Care - HMO | Source: Ambulatory Visit | Attending: Obstetrics & Gynecology | Admitting: Obstetrics & Gynecology

## 2015-07-15 ENCOUNTER — Ambulatory Visit (INDEPENDENT_AMBULATORY_CARE_PROVIDER_SITE_OTHER): Payer: Commercial Managed Care - HMO | Admitting: Obstetrics & Gynecology

## 2015-07-15 ENCOUNTER — Encounter: Payer: Self-pay | Admitting: Obstetrics & Gynecology

## 2015-07-15 VITALS — BP 121/81 | HR 79 | Temp 98.5°F | Wt 199.9 lb

## 2015-07-15 DIAGNOSIS — Z0000 Encounter for general adult medical examination without abnormal findings: Secondary | ICD-10-CM

## 2015-07-15 DIAGNOSIS — D500 Iron deficiency anemia secondary to blood loss (chronic): Secondary | ICD-10-CM | POA: Diagnosis not present

## 2015-07-15 DIAGNOSIS — N939 Abnormal uterine and vaginal bleeding, unspecified: Secondary | ICD-10-CM | POA: Insufficient documentation

## 2015-07-15 DIAGNOSIS — N921 Excessive and frequent menstruation with irregular cycle: Secondary | ICD-10-CM | POA: Diagnosis not present

## 2015-07-15 DIAGNOSIS — N840 Polyp of corpus uteri: Secondary | ICD-10-CM | POA: Diagnosis not present

## 2015-07-15 DIAGNOSIS — N920 Excessive and frequent menstruation with regular cycle: Secondary | ICD-10-CM | POA: Diagnosis not present

## 2015-07-15 LAB — TSH: TSH: 0.859 u[IU]/mL (ref 0.350–4.500)

## 2015-07-15 LAB — POCT PREGNANCY, URINE: PREG TEST UR: NEGATIVE

## 2015-07-15 NOTE — Progress Notes (Signed)
   Subjective:    Patient ID: [name], female    DOB: [birth year], 44 y.o.   MRN: [id number]  HPI  44 yo M AAP2 (23 and 8 yo kids) here today for follow up after ER visit for DUB/menorrhagia. Periods usually very heavy for 5-7 days. But this past month she has bleed most of the month. She got a blood transfusion in the last week of August 2016. No u/s done. She is taking iron pills daily. She is also on megace 80 mg BID.  Review of Systems She reports a normal pap smear at an outside facility that was normal.    Objective:   Physical Exam  Thin, WHBFNAD Abd- thin, benign EG, vagina, cervix- normal, some bleeding Bimanual exam- 12 week size uterus, c/w fibroid, mobile, NT Adnexa not enlarged or painful   UPT negative, consent signed, time out done Cervix prepped with betadine and grasped with a single tooth tenaculum Uterus sounded to 9 cm Pipelle used for 2 passes with a moderate amount of tissue obtained. She tolerated the procedure well.       Assessment & Plan:

## 2015-07-19 ENCOUNTER — Ambulatory Visit (HOSPITAL_COMMUNITY)
Admission: RE | Admit: 2015-07-19 | Discharge: 2015-07-19 | Disposition: A | Discharge status: Home / Self Care | Payer: Commercial Managed Care - HMO | Source: Ambulatory Visit | Attending: Obstetrics & Gynecology | Admitting: Obstetrics & Gynecology

## 2015-07-19 DIAGNOSIS — N939 Abnormal uterine and vaginal bleeding, unspecified: Secondary | ICD-10-CM | POA: Insufficient documentation

## 2015-07-19 DIAGNOSIS — N8320 Unspecified ovarian cysts: Secondary | ICD-10-CM | POA: Insufficient documentation

## 2015-07-19 DIAGNOSIS — D250 Submucous leiomyoma of uterus: Secondary | ICD-10-CM | POA: Diagnosis not present

## 2015-07-19 DIAGNOSIS — N921 Excessive and frequent menstruation with irregular cycle: Secondary | ICD-10-CM

## 2015-08-02 ENCOUNTER — Encounter: Payer: Self-pay | Admitting: Obstetrics & Gynecology

## 2015-08-02 ENCOUNTER — Ambulatory Visit (INDEPENDENT_AMBULATORY_CARE_PROVIDER_SITE_OTHER): Payer: Commercial Managed Care - HMO | Admitting: Obstetrics & Gynecology

## 2015-08-02 VITALS — BP 121/80 | HR 77 | Temp 98.6°F | Ht 67.0 in | Wt 122.0 lb

## 2015-08-02 DIAGNOSIS — D250 Submucous leiomyoma of uterus: Secondary | ICD-10-CM

## 2015-08-02 DIAGNOSIS — N921 Excessive and frequent menstruation with irregular cycle: Secondary | ICD-10-CM | POA: Diagnosis not present

## 2015-08-02 DIAGNOSIS — D219 Benign neoplasm of connective and other soft tissue, unspecified: Secondary | ICD-10-CM | POA: Insufficient documentation

## 2015-08-02 DIAGNOSIS — D500 Iron deficiency anemia secondary to blood loss (chronic): Secondary | ICD-10-CM
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
## 2015-08-05 ENCOUNTER — Encounter (HOSPITAL_COMMUNITY): Payer: Self-pay | Admitting: *Deleted

## 2015-08-31 ENCOUNTER — Encounter: Payer: Self-pay | Admitting: *Deleted

## 2015-09-20 ENCOUNTER — Inpatient Hospital Stay (HOSPITAL_COMMUNITY)
Admission: RE | Admit: 2015-09-20 | Discharge: 2015-09-20 | Disposition: A | Discharge status: Home / Self Care | Payer: Self-pay | Source: Ambulatory Visit

## 2015-09-21 ENCOUNTER — Inpatient Hospital Stay (HOSPITAL_COMMUNITY)
Admission: RE | Admit: 2015-09-21 | Discharge: 2015-09-21 | Disposition: A | Discharge status: Home / Self Care | Payer: Self-pay | Source: Ambulatory Visit

## 2015-09-21 NOTE — Patient Instructions (Signed)
Your procedure is scheduled on:  Tuesday, Nov. 29, 2016  Enter through the [employer] [facility] at:  6:00 AM  Pick up the phone at the desk and dial [phone].  Call this number if you have problems the morning of surgery: [phone].  Remember: Do NOT eat food or drink after: Midnight Monday, Nov. 28, 2016 Take these medicines the morning of surgery with a SIP OF WATER:  None  Do NOT wear jewelry (body piercing), metal hair clips/bobby pins, make-up, or nail polish. Do NOT wear lotions, powders, or perfumes.  You may wear deoderant. Do NOT shave for 48 hours prior to surgery. Do NOT bring valuables to the hospital. Contacts, dentures, or bridgework may not be worn into surgery. Leave suitcase in car.  After surgery it may be brought to your room.  For patients admitted to the hospital, checkout time is 11:00 AM the day of discharge.

## 2015-09-27 ENCOUNTER — Encounter: Payer: Self-pay | Admitting: *Deleted

## 2015-09-27 NOTE — Progress Notes (Signed)
Patient is scheduled for TAH on 10/05/15.  Preauthorization required.  Information faxed to [facility] Management on 09/27/15 at fax # [phone].  Copy under media tab.

## 2015-10-04 ENCOUNTER — Telehealth (HOSPITAL_COMMUNITY): Payer: Self-pay | Admitting: Obstetrics & Gynecology

## 2015-10-04 NOTE — Telephone Encounter (Signed)
Received call this morning from pre-op regarding patient who has no showed for pre op appointment and states to them she is not having her surgery.  Called patient to confirm desire to cancel. She states she is scared to have procedure.  She states she called two weeks ago, but was not sure who or what number she called.  Called Chasity in [facility] and cancelled case.

## 2015-10-05 ENCOUNTER — Inpatient Hospital Stay (HOSPITAL_COMMUNITY)
Admission: RE | Admit: 2015-10-05 | Payer: Commercial Managed Care - HMO | Source: Ambulatory Visit | Admitting: Obstetrics & Gynecology

## 2015-10-05 ENCOUNTER — Encounter (HOSPITAL_COMMUNITY): Admission: RE | Payer: Self-pay | Source: Ambulatory Visit

## 2015-10-05 SURGERY — HYSTERECTOMY, ABDOMINAL
Anesthesia: Choice | Site: Abdomen

## 2015-12-29 DIAGNOSIS — N63 Unspecified lump in breast: Secondary | ICD-10-CM | POA: Diagnosis not present
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
## 2017-10-05 ENCOUNTER — Ambulatory Visit: Payer: Medicare HMO | Admitting: Obstetrics & Gynecology

## 2017-10-05 ENCOUNTER — Other Ambulatory Visit (HOSPITAL_COMMUNITY)
Admission: RE | Admit: 2017-10-05 | Discharge: 2017-10-05 | Disposition: A | Discharge status: Home / Self Care | Payer: Medicare HMO | Source: Ambulatory Visit | Attending: Obstetrics & Gynecology | Admitting: Obstetrics & Gynecology

## 2017-10-05 ENCOUNTER — Encounter: Payer: Self-pay | Admitting: Obstetrics & Gynecology

## 2017-10-05 VITALS — BP 104/62 | HR 86 | Ht 67.0 in | Wt 114.0 lb

## 2017-10-05 DIAGNOSIS — D250 Submucous leiomyoma of uterus: Secondary | ICD-10-CM | POA: Diagnosis not present

## 2017-10-05 DIAGNOSIS — Z1231 Encounter for screening mammogram for malignant neoplasm of breast: Secondary | ICD-10-CM

## 2017-10-05 DIAGNOSIS — Z0000 Encounter for general adult medical examination without abnormal findings: Secondary | ICD-10-CM | POA: Diagnosis not present

## 2017-10-05 MED ORDER — NORGESTREL-ETHINYL ESTRADIOL 0.3-30 MG-MCG PO TABS: 1.0000 | ORAL_TABLET | Freq: Every day | ORAL | 11 refills | Status: DC
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
## 2017-10-05 NOTE — Progress Notes (Signed)
Scheduled mammogram for 12/27 @ 730am

## 2017-10-09 LAB — CYTOLOGY - PAP
Chlamydia: NEGATIVE
Diagnosis: NEGATIVE
HPV: NOT DETECTED
Neisseria Gonorrhea: NEGATIVE

## 2017-10-28 ENCOUNTER — Encounter: Payer: Self-pay | Admitting: Obstetrics & Gynecology

## 2017-11-01 ENCOUNTER — Ambulatory Visit
Admission: RE | Admit: 2017-11-01 | Discharge: 2017-11-01 | Disposition: A | Discharge status: Home / Self Care | Payer: Medicare HMO | Source: Ambulatory Visit | Attending: Obstetrics & Gynecology | Admitting: Obstetrics & Gynecology

## 2017-11-01 DIAGNOSIS — Z1231 Encounter for screening mammogram for malignant neoplasm of breast: Secondary | ICD-10-CM | POA: Diagnosis not present

## 2017-11-07 ENCOUNTER — Other Ambulatory Visit: Payer: Self-pay | Admitting: General Practice

## 2017-11-07 ENCOUNTER — Other Ambulatory Visit: Payer: Self-pay | Admitting: *Deleted

## 2017-11-07 ENCOUNTER — Encounter (HOSPITAL_COMMUNITY): Payer: Self-pay

## 2017-11-07 DIAGNOSIS — D649 Anemia, unspecified: Secondary | ICD-10-CM | POA: Insufficient documentation

## 2017-11-07 DIAGNOSIS — D508 Other iron deficiency anemias: Secondary | ICD-10-CM

## 2017-11-19 ENCOUNTER — Telehealth: Payer: Self-pay | Admitting: General Practice

## 2017-11-19 NOTE — Telephone Encounter (Signed)
Called and notified patient of lab visit.  Patient is currently out of town and stated that she would be back in town after Monday, 11/26/17.  Patient is scheduled on 11/28/17 at 10:00am.  Patient voiced understanding.

## 2017-11-26 ENCOUNTER — Encounter (HOSPITAL_COMMUNITY): Payer: Self-pay | Admitting: *Deleted

## 2017-11-28 ENCOUNTER — Other Ambulatory Visit: Payer: Self-pay

## 2017-11-28 DIAGNOSIS — Z0000 Encounter for general adult medical examination without abnormal findings: Secondary | ICD-10-CM

## 2017-12-03 NOTE — Patient Instructions (Addendum)
Your procedure is scheduled on: Tuesday December 11, 2017 at 7:30 am  Enter through the Main Entrance of [facility] at: 6:00 am  Pick up the phone at the desk and dial [phone].  Call this number if you have problems the morning of surgery: [phone].  Remember: Do NOT eat food or drink any liquids after: Midnight on Monday February 4  STOP ALL VITAMINS AND SUPPLEMENTS 1 WEEK PRIOR TO SURGERY  DO NOT SMOKE DAY OF SURGERY  Take these medicines the morning of surgery with a SIP OF WATER: NONE  Do NOT wear jewelry (body piercing), metal hair clips/bobby pins, make-up, or nail polish. Do NOT wear lotions, powders, or perfumes.  You may wear deoderant. Do NOT shave for 48 hours prior to surgery. Do NOT bring valuables to the hospital. Contacts, dentures, or bridgework may not be worn into surgery. Leave suitcase in car.  After surgery it may be brought to your room.  For patients admitted to the hospital, checkout time is 11:00 AM the day of discharge.

## 2017-12-05 ENCOUNTER — Observation Stay (HOSPITAL_BASED_OUTPATIENT_CLINIC_OR_DEPARTMENT_OTHER)
Admission: AD | Admit: 2017-12-05 | Discharge: 2017-12-06 | Disposition: A | Discharge status: Home / Self Care | Payer: Medicare HMO | Source: Ambulatory Visit | Attending: Obstetrics and Gynecology | Admitting: Obstetrics and Gynecology

## 2017-12-05 ENCOUNTER — Encounter (HOSPITAL_COMMUNITY)
Admission: RE | Admit: 2017-12-05 | Discharge: 2017-12-05 | Disposition: A | Discharge status: Home / Self Care | Payer: Medicare HMO | Source: Ambulatory Visit | Attending: Obstetrics & Gynecology | Admitting: Obstetrics & Gynecology

## 2017-12-05 ENCOUNTER — Other Ambulatory Visit: Payer: Self-pay

## 2017-12-05 ENCOUNTER — Encounter (HOSPITAL_COMMUNITY): Payer: Self-pay

## 2017-12-05 DIAGNOSIS — D649 Anemia, unspecified: Secondary | ICD-10-CM | POA: Diagnosis not present

## 2017-12-05 DIAGNOSIS — N939 Abnormal uterine and vaginal bleeding, unspecified: Secondary | ICD-10-CM | POA: Diagnosis not present

## 2017-12-05 DIAGNOSIS — N938 Other specified abnormal uterine and vaginal bleeding: Secondary | ICD-10-CM | POA: Insufficient documentation

## 2017-12-05 DIAGNOSIS — Z01818 Encounter for other preprocedural examination: Secondary | ICD-10-CM

## 2017-12-05 HISTORY — DX: Encounter for other specified aftercare: Z51.89

## 2017-12-05 LAB — BASIC METABOLIC PANEL
ANION GAP: 9 (ref 5–15)
BUN: 7 mg/dL (ref 6–20)
CHLORIDE: 104 mmol/L (ref 101–111)
CO2: 21 mmol/L — ABNORMAL LOW (ref 22–32)
Calcium: 8.9 mg/dL (ref 8.9–10.3)
Creatinine, Ser: 0.55 mg/dL (ref 0.44–1.00)
GFR calc non Af Amer: >60 mL/min (ref >60)
Glucose, Bld: 82 mg/dL (ref 65–99)
POTASSIUM: 3.6 mmol/L (ref 3.5–5.1)
SODIUM: 134 mmol/L — AB (ref 135–145)

## 2017-12-05 LAB — CBC
HEMATOCRIT: 20 % — AB (ref 36.0–46.0)
Hemoglobin: 4.9 g/dL — CL (ref 12.0–15.0)
MCH: 15.1 pg — ABNORMAL LOW (ref 26.0–34.0)
MCHC: 24.5 g/dL — ABNORMAL LOW (ref 30.0–36.0)
MCV: 61.5 fL — AB (ref 78.0–100.0)
Platelets: 128 10*3/uL — ABNORMAL LOW (ref 150–400)
RBC: 3.25 MIL/uL — ABNORMAL LOW (ref 3.87–5.11)
RDW: 25.6 % — ABNORMAL HIGH (ref 11.5–15.5)
WBC: 9.3 10*3/uL (ref 4.0–10.5)

## 2017-12-05 LAB — ABO/RH: ABO/RH(D): A POS

## 2017-12-05 LAB — PREPARE RBC (CROSSMATCH)

## 2017-12-05 MED ORDER — PRENATAL MULTIVITAMIN CH: 1.0000 | ORAL_TABLET | Freq: Every day | ORAL | Status: DC

## 2017-12-05 MED ORDER — DIPHENHYDRAMINE HCL 25 MG PO CAPS
25.0000 mg | ORAL_CAPSULE | Freq: Once | ORAL | Status: AC
  Administered 2017-12-05: 25 mg via ORAL
  Filled 2017-12-05: qty 1

## 2017-12-05 MED ORDER — ACETAMINOPHEN 325 MG PO TABS
650.0000 mg | ORAL_TABLET | Freq: Once | ORAL | Status: AC
  Administered 2017-12-05: 650 mg via ORAL
  Filled 2017-12-05: qty 2

## 2017-12-05 MED ORDER — SODIUM CHLORIDE 0.9 % IV SOLN
Freq: Once | INTRAVENOUS | Status: AC
  Administered 2017-12-05: 18:00:00 via INTRAVENOUS
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
## 2017-12-05 NOTE — Pre-Procedure Instructions (Signed)
Dr. Hulan Fray notified of hemoglobin of 4.9. Instructed me to call patient and have her return immediately to MAU for transfusion

## 2017-12-05 NOTE — MAU Note (Signed)
Went in this AM for pre-op testing, scheduled for a hysterectomy on 02/05  Nurse Tammy called pt and told pt that hgb is low (around 4) and was instructed to come to MAU. Told that Dr Hulan Fray requested she come in  Does not feel lightheaded or dizzy

## 2017-12-06 DIAGNOSIS — D649 Anemia, unspecified: Secondary | ICD-10-CM | POA: Diagnosis not present

## 2017-12-06 LAB — TYPE AND SCREEN
ABO/RH(D): A POS
Antibody Screen: NEGATIVE
UNIT DIVISION: 0
Unit division: 0
Unit division: 0

## 2017-12-06 LAB — BPAM RBC
BLOOD PRODUCT EXPIRATION DATE: 201902072359
BLOOD PRODUCT EXPIRATION DATE: 201902072359
Blood Product Expiration Date: 201902152359
ISSUE DATE / TIME: 201901301738
ISSUE DATE / TIME: 201901302017
ISSUE DATE / TIME: 201901302307
UNIT TYPE AND RH: 6200
UNIT TYPE AND RH: 6200
Unit Type and Rh: 6200

## 2017-12-06 MED ORDER — DOCUSATE SODIUM 100 MG PO CAPS: 100.0000 mg | ORAL_CAPSULE | Freq: Two times a day (BID) | ORAL | 2 refills | Status: DC | PRN

## 2017-12-06 MED ORDER — FERROUS SULFATE 325 (65 FE) MG PO TABS: 325.0000 mg | ORAL_TABLET | Freq: Two times a day (BID) | ORAL | 1 refills | Status: DC

## 2017-12-06 NOTE — Progress Notes (Signed)
Pt discharged with printed instructions. Pt verbalized an understanding. No concerns noted. Doroteo Nickolson L Vy Badley, RN 

## 2017-12-06 NOTE — Discharge Instructions (Signed)

## 2017-12-07 LAB — CBC
HCT: 27.1 % — ABNORMAL LOW (ref 36.0–46.0)
HEMOGLOBIN: 8 g/dL — AB (ref 12.0–15.0)
MCH: 20.1 pg — ABNORMAL LOW (ref 26.0–34.0)
MCHC: 29.5 g/dL — ABNORMAL LOW (ref 30.0–36.0)
MCV: 67.9 fL — ABNORMAL LOW (ref 78.0–100.0)
Platelets: 97 10*3/uL — ABNORMAL LOW (ref 150–400)
RBC: 3.99 MIL/uL (ref 3.87–5.11)
RDW: 25.7 % — ABNORMAL HIGH (ref 11.5–15.5)
WBC: 12.5 10*3/uL — ABNORMAL HIGH (ref 4.0–10.5)

## 2017-12-07 NOTE — Discharge Summary (Signed)
    OB Discharge Summary     Patient Name: [name] DOB: 10-12-1971 MRN: [id number]  Date of admission: 12/05/2017  Date of discharge: 12/07/2017  Admitting diagnosis: LOW HGB Intrauterine pregnancy: Unknown     Secondary diagnosis:  Active Problems:   Symptomatic anemia  Hospital course:  Patient scheduled for TAH with BS on 12/11/2017 and was found to have a Hg 4.9 during her preop visit. Patient denies chest pain, shortness of breath, lightheadedness/dizziness. Patient reported feeling "bad" on admission but attributed it to a recent death in the family. She was admitted for observation and received 3 units pRBC. Her post transfusion Hg rose to 8. Patient was discharge with iron supplements and instructed to follow up as planned for her surgery  Physical exam  Vitals:   12/05/17 2342 12/06/17 0130 12/06/17 0536 12/06/17 0800  BP: 109/67 115/81 104/89 115/79  Pulse: 79 88 72 74  Resp: 17 16 16 16   Temp: 98.9 F (37.2 C) 98.7 F (37.1 C) 97.9 F (36.6 C) 98.5 F (36.9 C)  TempSrc: Oral Oral Oral Oral  SpO2: 100% 100% 100% 99%  Weight:      Height:       General: alert, cooperative and no distress Lungs: clear to auscultation bilaterally Hear: regular rate and rhythm Abdomen: soft, non tender, no distended DVT Evaluation: No evidence of DVT seen on physical exam. Negative Homan's sign. No cords or calf tenderness. Labs: Lab Results  Component Value Date   WBC 12.5 (H) 12/06/2017   HGB 8.0 (L) 12/06/2017   HCT 27.1 (L) 12/06/2017   MCV 67.9 (L) 12/06/2017   PLT 97 (L) 12/06/2017   CMP Latest Ref Rng & Units 12/05/2017  Glucose 65 - 99 mg/dL 82  BUN 6 - 20 mg/dL 7  Creatinine 0.44 - 1.00 mg/dL 0.55  Sodium 135 - 145 mmol/L 134(L)  Potassium 3.5 - 5.1 mmol/L 3.6  Chloride 101 - 111 mmol/L 104  CO2 22 - 32 mmol/L 21(L)  Calcium 8.9 - 10.3 mg/dL 8.9  Total Protein 6.5 - 8.1 g/dL -  Total Bilirubin 0.3 - 1.2 mg/dL -  Alkaline Phos 38 - 126 U/L -  AST 15 - 41 U/L  -  ALT 14 - 54 U/L -    Discharge instruction: per After Visit Summary and "Baby and Me Booklet".  After visit meds:  Allergies as of 12/06/2017   No Known Allergies     Medication List    STOP taking these medications   norgestrel-ethinyl estradiol 0.3-30 MG-MCG tablet Commonly known as:  LO/OVRAL,CRYSELLE     TAKE these medications   docusate sodium 100 MG capsule Commonly known as:  COLACE Take 1 capsule (100 mg total) by mouth 2 (two) times daily as needed.   ferrous sulfate 325 (65 FE) MG tablet Commonly known as:  FERROUSUL Take 1 tablet (325 mg total) by mouth 2 (two) times daily.       Diet: routine diet  Activity: Advance as tolerated. Pelvic rest for 6 weeks.   Outpatient follow up: Follow up Appt:No future appointments. Follow up Visit:No Follow-up on file.   12/07/2017 Mora Bellman, MD

## 2017-12-10 ENCOUNTER — Telehealth: Payer: Self-pay | Admitting: *Deleted

## 2017-12-10 NOTE — Telephone Encounter (Signed)
Received a voicemail from [place] in precert center requesting prior auth for inpatient surgery tomorrow. [facility] and given reference [id number]. Authorization# [id number]. Called Sharlee Blew and left message  prior Josem Kaufmann #[id number].

## 2017-12-11 ENCOUNTER — Encounter (HOSPITAL_COMMUNITY): Payer: Self-pay

## 2017-12-11 ENCOUNTER — Other Ambulatory Visit: Payer: Self-pay

## 2017-12-11 ENCOUNTER — Observation Stay (HOSPITAL_COMMUNITY)
Admission: AD | Admit: 2017-12-11 | Discharge: 2017-12-12 | Disposition: A | Discharge status: Home / Self Care | Payer: Medicare HMO | Source: Ambulatory Visit | Attending: Obstetrics & Gynecology | Admitting: Obstetrics & Gynecology

## 2017-12-11 ENCOUNTER — Inpatient Hospital Stay (HOSPITAL_COMMUNITY): Payer: Medicare HMO | Admitting: Anesthesiology

## 2017-12-11 ENCOUNTER — Encounter (HOSPITAL_COMMUNITY)
Admission: AD | Disposition: A | Discharge status: Home / Self Care | Payer: Self-pay | Source: Ambulatory Visit | Attending: Obstetrics & Gynecology

## 2017-12-11 DIAGNOSIS — D649 Anemia, unspecified: Secondary | ICD-10-CM | POA: Diagnosis not present

## 2017-12-11 DIAGNOSIS — N938 Other specified abnormal uterine and vaginal bleeding: Secondary | ICD-10-CM | POA: Diagnosis not present

## 2017-12-11 DIAGNOSIS — N7011 Chronic salpingitis: Secondary | ICD-10-CM | POA: Insufficient documentation

## 2017-12-11 DIAGNOSIS — N8301 Follicular cyst of right ovary: Secondary | ICD-10-CM | POA: Insufficient documentation

## 2017-12-11 DIAGNOSIS — D250 Submucous leiomyoma of uterus: Secondary | ICD-10-CM | POA: Diagnosis not present

## 2017-12-11 DIAGNOSIS — N736 Female pelvic peritoneal adhesions (postinfective): Secondary | ICD-10-CM | POA: Insufficient documentation

## 2017-12-11 DIAGNOSIS — N83201 Unspecified ovarian cyst, right side: Secondary | ICD-10-CM | POA: Insufficient documentation

## 2017-12-11 DIAGNOSIS — N879 Dysplasia of cervix uteri, unspecified: Secondary | ICD-10-CM | POA: Insufficient documentation

## 2017-12-11 DIAGNOSIS — D259 Leiomyoma of uterus, unspecified: Secondary | ICD-10-CM | POA: Diagnosis not present

## 2017-12-11 DIAGNOSIS — Z9889 Other specified postprocedural states: Secondary | ICD-10-CM

## 2017-12-11 HISTORY — PX: LAPAROSCOPIC VAGINAL HYSTERECTOMY WITH SALPINGECTOMY: SHX6680

## 2017-12-11 HISTORY — PX: LAPAROSCOPIC LYSIS OF ADHESIONS: SHX5905

## 2017-12-11 HISTORY — PX: REPAIR VAGINAL CUFF: SHX6067

## 2017-12-11 HISTORY — PX: CYSTOSCOPY: SHX5120

## 2017-12-11 LAB — CBC
HEMATOCRIT: 32 % — AB (ref 36.0–46.0)
HEMOGLOBIN: 9 g/dL — AB (ref 12.0–15.0)
MCH: 20.8 pg — ABNORMAL LOW (ref 26.0–34.0)
MCHC: 28.1 g/dL — ABNORMAL LOW (ref 30.0–36.0)
MCV: 73.9 fL — ABNORMAL LOW (ref 78.0–100.0)
Platelets: 133 10*3/uL — ABNORMAL LOW (ref 150–400)
RBC: 4.33 MIL/uL (ref 3.87–5.11)
RDW: 30.8 % — AB (ref 11.5–15.5)
WBC: 8.5 10*3/uL (ref 4.0–10.5)

## 2017-12-11 LAB — BASIC METABOLIC PANEL
ANION GAP: 7 (ref 5–15)
BUN: 9 mg/dL (ref 6–20)
CALCIUM: 8.9 mg/dL (ref 8.9–10.3)
CO2: 22 mmol/L (ref 22–32)
Chloride: 106 mmol/L (ref 101–111)
Creatinine, Ser: 0.53 mg/dL (ref 0.44–1.00)
GFR calc non Af Amer: >60 mL/min (ref >60)
Glucose, Bld: 87 mg/dL (ref 65–99)
POTASSIUM: 3.8 mmol/L (ref 3.5–5.1)
Sodium: 135 mmol/L (ref 135–145)

## 2017-12-11 LAB — PREGNANCY, URINE: Preg Test, Ur: NEGATIVE

## 2017-12-11 LAB — PREPARE RBC (CROSSMATCH)

## 2017-12-11 SURGERY — HYSTERECTOMY, VAGINAL, LAPAROSCOPY-ASSISTED, WITH SALPINGECTOMY
Anesthesia: General | Laterality: Bilateral

## 2017-12-11 MED ORDER — PROPOFOL 10 MG/ML IV BOLUS
INTRAVENOUS | Status: DC | PRN
  Administered 2017-12-11: 20 mg via INTRAVENOUS
  Administered 2017-12-11: 150 mg via INTRAVENOUS
  Administered 2017-12-11: 30 mg via INTRAVENOUS

## 2017-12-11 MED ORDER — DEXAMETHASONE SODIUM PHOSPHATE 10 MG/ML IJ SOLN
INTRAMUSCULAR | Status: AC
  Filled 2017-12-11: qty 1

## 2017-12-11 MED ORDER — PROPOFOL 10 MG/ML IV BOLUS
INTRAVENOUS | Status: AC
  Filled 2017-12-11: qty 20

## 2017-12-11 MED ORDER — FAMOTIDINE 20 MG PO TABS
20.0000 mg | ORAL_TABLET | Freq: Once | ORAL | Status: AC
  Administered 2017-12-11: 20 mg via ORAL
  Filled 2017-12-11: qty 1

## 2017-12-11 MED ORDER — HYDROMORPHONE HCL 1 MG/ML IJ SOLN
INTRAMUSCULAR | Status: AC
  Filled 2017-12-11: qty 1

## 2017-12-11 MED ORDER — BUPIVACAINE HCL (PF) 0.5 % IJ SOLN
INTRAMUSCULAR | Status: DC | PRN
  Administered 2017-12-11: 6 mL

## 2017-12-11 MED ORDER — ONDANSETRON HCL 4 MG PO TABS: 4.0000 mg | ORAL_TABLET | Freq: Four times a day (QID) | ORAL | Status: DC | PRN

## 2017-12-11 MED ORDER — SCOPOLAMINE 1 MG/3DAYS TD PT72
MEDICATED_PATCH | TRANSDERMAL | Status: AC
  Administered 2017-12-11: 1.5 mg via TRANSDERMAL
  Filled 2017-12-11: qty 1

## 2017-12-11 MED ORDER — PROMETHAZINE HCL 25 MG/ML IJ SOLN: 6.2500 mg | INTRAMUSCULAR | Status: DC | PRN

## 2017-12-11 MED ORDER — SODIUM CHLORIDE 0.9 % IJ SOLN
INTRAMUSCULAR | Status: DC | PRN
  Administered 2017-12-11: 100 mL via INTRAVENOUS

## 2017-12-11 MED ORDER — BUPIVACAINE-EPINEPHRINE (PF) 0.5% -1:200000 IJ SOLN
INTRAMUSCULAR | Status: AC
  Filled 2017-12-11: qty 30

## 2017-12-11 MED ORDER — ROCURONIUM BROMIDE 100 MG/10ML IV SOLN
INTRAVENOUS | Status: DC | PRN
  Administered 2017-12-11: 10 mg via INTRAVENOUS
  Administered 2017-12-11: 50 mg via INTRAVENOUS
  Administered 2017-12-11: 20 mg via INTRAVENOUS

## 2017-12-11 MED ORDER — CEFAZOLIN SODIUM-DEXTROSE 2-4 GM/100ML-% IV SOLN: 2.0000 g | INTRAVENOUS | Status: DC

## 2017-12-11 MED ORDER — SUGAMMADEX SODIUM 200 MG/2ML IV SOLN
INTRAVENOUS | Status: AC
  Filled 2017-12-11: qty 2

## 2017-12-11 MED ORDER — HYDROMORPHONE HCL 1 MG/ML IJ SOLN
INTRAMUSCULAR | Status: AC
  Administered 2017-12-11: 0.5 mg via INTRAVENOUS
  Filled 2017-12-11: qty 1

## 2017-12-11 MED ORDER — LACTATED RINGERS IV SOLN
INTRAVENOUS | Status: DC
  Administered 2017-12-11 (×3): via INTRAVENOUS

## 2017-12-11 MED ORDER — HYDROMORPHONE HCL 1 MG/ML IJ SOLN
0.2000 mg | INTRAMUSCULAR | Status: DC | PRN
  Administered 2017-12-11 (×2): 0.4 mg via INTRAVENOUS
  Administered 2017-12-12: 0.6 mg via INTRAVENOUS
  Filled 2017-12-11 (×3): qty 1

## 2017-12-11 MED ORDER — FENTANYL CITRATE (PF) 250 MCG/5ML IJ SOLN
INTRAMUSCULAR | Status: AC
  Filled 2017-12-11: qty 5

## 2017-12-11 MED ORDER — METHYLENE BLUE 0.5 % INJ SOLN
INTRAVENOUS | Status: DC | PRN
  Administered 2017-12-11: 6 mL via INTRAVENOUS

## 2017-12-11 MED ORDER — ONDANSETRON HCL 4 MG/2ML IJ SOLN
4.0000 mg | Freq: Four times a day (QID) | INTRAMUSCULAR | Status: DC | PRN
  Filled 2017-12-11: qty 2

## 2017-12-11 MED ORDER — SODIUM CHLORIDE 0.9 % IV SOLN: Freq: Once | INTRAVENOUS | Status: DC

## 2017-12-11 MED ORDER — BUPIVACAINE HCL (PF) 0.5 % IJ SOLN
INTRAMUSCULAR | Status: AC
  Filled 2017-12-11: qty 30

## 2017-12-11 MED ORDER — MIDAZOLAM HCL 2 MG/2ML IJ SOLN
INTRAMUSCULAR | Status: AC
  Filled 2017-12-11: qty 2

## 2017-12-11 MED ORDER — LIDOCAINE HCL (CARDIAC) 20 MG/ML IV SOLN
INTRAVENOUS | Status: AC
  Filled 2017-12-11: qty 5

## 2017-12-11 MED ORDER — LACTATED RINGERS IV SOLN
INTRAVENOUS | Status: DC
  Administered 2017-12-11: 22:00:00 via INTRAVENOUS

## 2017-12-11 MED ORDER — DEXAMETHASONE SODIUM PHOSPHATE 10 MG/ML IJ SOLN
INTRAMUSCULAR | Status: DC | PRN
  Administered 2017-12-11: 10 mg via INTRAVENOUS

## 2017-12-11 MED ORDER — HYDROMORPHONE HCL 1 MG/ML IJ SOLN
INTRAMUSCULAR | Status: DC | PRN
  Administered 2017-12-11: 1 mg via INTRAVENOUS

## 2017-12-11 MED ORDER — CEFAZOLIN SODIUM-DEXTROSE 2-4 GM/100ML-% IV SOLN
2.0000 g | INTRAVENOUS | Status: AC
  Administered 2017-12-11: 2 g via INTRAVENOUS

## 2017-12-11 MED ORDER — ONDANSETRON HCL 4 MG/2ML IJ SOLN
INTRAMUSCULAR | Status: AC
  Filled 2017-12-11: qty 2

## 2017-12-11 MED ORDER — BUPIVACAINE-EPINEPHRINE 0.5% -1:200000 IJ SOLN
INTRAMUSCULAR | Status: DC | PRN
  Administered 2017-12-11: 30 mL

## 2017-12-11 MED ORDER — HYDROMORPHONE HCL 1 MG/ML IJ SOLN
0.2500 mg | INTRAMUSCULAR | Status: DC | PRN
  Administered 2017-12-11: 0.5 mg via INTRAVENOUS
  Administered 2017-12-11: 0.25 mg via INTRAVENOUS

## 2017-12-11 MED ORDER — FENTANYL CITRATE (PF) 250 MCG/5ML IJ SOLN
INTRAMUSCULAR | Status: DC | PRN
  Administered 2017-12-11: 100 ug via INTRAVENOUS
  Administered 2017-12-11: 50 ug via INTRAVENOUS
  Administered 2017-12-11: 100 ug via INTRAVENOUS

## 2017-12-11 MED ORDER — OXYCODONE HCL 5 MG PO TABS: 5.0000 mg | ORAL_TABLET | Freq: Once | ORAL | Status: DC | PRN

## 2017-12-11 MED ORDER — LIDOCAINE HCL (CARDIAC) 20 MG/ML IV SOLN
INTRAVENOUS | Status: DC | PRN
  Administered 2017-12-11: 40 mg via INTRAVENOUS

## 2017-12-11 MED ORDER — IBUPROFEN 800 MG PO TABS
800.0000 mg | ORAL_TABLET | Freq: Three times a day (TID) | ORAL | Status: DC | PRN
  Administered 2017-12-11: 800 mg via ORAL
  Filled 2017-12-11: qty 1

## 2017-12-11 MED ORDER — OXYCODONE HCL 5 MG/5ML PO SOLN: 5.0000 mg | Freq: Once | ORAL | Status: DC | PRN

## 2017-12-11 MED ORDER — ONDANSETRON HCL 4 MG/2ML IJ SOLN
INTRAMUSCULAR | Status: DC | PRN
  Administered 2017-12-11: 4 mg via INTRAVENOUS

## 2017-12-11 MED ORDER — MIDAZOLAM HCL 5 MG/5ML IJ SOLN
INTRAMUSCULAR | Status: DC | PRN
  Administered 2017-12-11: 2 mg via INTRAVENOUS

## 2017-12-11 MED ORDER — CEFAZOLIN SODIUM-DEXTROSE 2-4 GM/100ML-% IV SOLN
INTRAVENOUS | Status: AC
  Filled 2017-12-11: qty 100

## 2017-12-11 MED ORDER — SUGAMMADEX SODIUM 200 MG/2ML IV SOLN
INTRAVENOUS | Status: DC | PRN
  Administered 2017-12-11: 200 mg via INTRAVENOUS

## 2017-12-11 MED ORDER — METHYLENE BLUE 0.5 % INJ SOLN
INTRAVENOUS | Status: AC
  Filled 2017-12-11: qty 10

## 2017-12-11 MED ORDER — SCOPOLAMINE 1 MG/3DAYS TD PT72
1.0000 | MEDICATED_PATCH | Freq: Once | TRANSDERMAL | Status: DC
  Administered 2017-12-11: 1.5 mg via TRANSDERMAL

## 2017-12-11 MED ORDER — SODIUM CHLORIDE 0.9 % IJ SOLN
INTRAMUSCULAR | Status: AC
  Filled 2017-12-11: qty 100

## 2017-12-11 SURGICAL SUPPLY — 47 items
APPLICATOR ARISTA FLEXITIP XL (MISCELLANEOUS) IMPLANT
APPLICATOR COTTON TIP 6IN STRL (MISCELLANEOUS) IMPLANT
CANISTER SUCT 3000ML PPV (MISCELLANEOUS) ×4 IMPLANT
CLOSURE WOUND 1/2 X4 (GAUZE/BANDAGES/DRESSINGS) ×2
CONT PATH 16OZ SNAP LID 3702 (MISCELLANEOUS) ×4 IMPLANT
COVER MAYO STAND STRL (DRAPES) IMPLANT
DECANTER SPIKE VIAL GLASS SM (MISCELLANEOUS) IMPLANT
DRSG OPSITE POSTOP 3X4 (GAUZE/BANDAGES/DRESSINGS) ×4 IMPLANT
DURAPREP 26ML APPLICATOR (WOUND CARE) ×4 IMPLANT
ELECT REM PT RETURN 9FT ADLT (ELECTROSURGICAL)
ELECTRODE REM PT RTRN 9FT ADLT (ELECTROSURGICAL) IMPLANT
GLOVE BIO SURGEON STRL SZ 6.5 (GLOVE) ×9 IMPLANT
GLOVE BIO SURGEONS STRL SZ 6.5 (GLOVE) ×3
GLOVE BIOGEL PI IND STRL 7.0 (GLOVE) ×4 IMPLANT
GLOVE BIOGEL PI INDICATOR 7.0 (GLOVE) ×4
HEMOSTAT ARISTA ABSORB 3G PWDR (MISCELLANEOUS) IMPLANT
LEGGING LITHOTOMY PAIR STRL (DRAPES) ×4 IMPLANT
NDL SAFETY ECLIPSE 18X1.5 (NEEDLE) ×2 IMPLANT
NEEDLE HYPO 18GX1.5 SHARP (NEEDLE) ×2
NEEDLE INSUFFLATION 120MM (ENDOMECHANICALS) ×4 IMPLANT
NEEDLE MAYO .5 CIRCLE (NEEDLE) IMPLANT
NEEDLE SPNL 18GX3.5 QUINCKE PK (NEEDLE) ×4 IMPLANT
NS IRRIG 1000ML POUR BTL (IV SOLUTION) ×4 IMPLANT
PACK LAVH (CUSTOM PROCEDURE TRAY) ×4 IMPLANT
PACK ROBOTIC GOWN (GOWN DISPOSABLE) ×4 IMPLANT
PACK TRENDGUARD 450 HYBRID PRO (MISCELLANEOUS) ×2 IMPLANT
PACK TRENDGUARD 600 HYBRD PROC (MISCELLANEOUS) IMPLANT
POUCH SPECIMEN RETRIEVAL 10MM (ENDOMECHANICALS) ×4 IMPLANT
PROTECTOR NERVE ULNAR (MISCELLANEOUS) ×8 IMPLANT
SET CYSTO W/LG BORE CLAMP LF (SET/KITS/TRAYS/PACK) ×4 IMPLANT
SET IRRIG TUBING LAPAROSCOPIC (IRRIGATION / IRRIGATOR) ×4 IMPLANT
SHEARS HARMONIC ACE PLUS 36CM (ENDOMECHANICALS) ×4 IMPLANT
SLEEVE XCEL OPT CAN 5 100 (ENDOMECHANICALS) ×8 IMPLANT
STRIP CLOSURE SKIN 1/2X4 (GAUZE/BANDAGES/DRESSINGS) ×6 IMPLANT
SUT VIC AB 0 CT1 36 (SUTURE) ×4 IMPLANT
SUT VIC AB 2-0 CT1 18 (SUTURE) ×8 IMPLANT
SUT VIC AB 4-0 PS2 27 (SUTURE) IMPLANT
SUT VICRYL 0 UR6 27IN ABS (SUTURE) ×4 IMPLANT
SYR 30ML LL (SYRINGE) ×4 IMPLANT
SYR BULB IRRIGATION 50ML (SYRINGE) IMPLANT
TOWEL OR 17X24 6PK STRL BLUE (TOWEL DISPOSABLE) ×8 IMPLANT
TRAY FOLEY CATH SILVER 14FR (SET/KITS/TRAYS/PACK) ×4 IMPLANT
TRENDGUARD 450 HYBRID PRO PACK (MISCELLANEOUS) ×4
TRENDGUARD 600 HYBRID PROC PK (MISCELLANEOUS)
TROCAR OPTI TIP 5M 100M (ENDOMECHANICALS) ×4 IMPLANT
TROCAR XCEL DIL TIP R 11M (ENDOMECHANICALS) ×4 IMPLANT
WARMER LAPAROSCOPE (MISCELLANEOUS) ×4 IMPLANT

## 2017-12-11 NOTE — Anesthesia Preprocedure Evaluation (Signed)
Anesthesia Evaluation  Patient identified by MRN, date of birth, ID band Patient awake    Reviewed: Allergy & Precautions, NPO status , Patient's Chart, lab work & pertinent test results  Airway Mallampati: II  TM Distance: >3 FB Neck ROM: Full    Dental no notable dental hx.    Pulmonary Current Smoker,    Pulmonary exam normal breath sounds clear to auscultation       Cardiovascular negative cardio ROS Normal cardiovascular exam Rhythm:Regular Rate:Normal     Neuro/Psych Anxiety negative neurological ROS     GI/Hepatic negative GI ROS, Neg liver ROS,   Endo/Other  negative endocrine ROS  Renal/GU negative Renal ROS     Musculoskeletal negative musculoskeletal ROS (+)   Abdominal   Peds  Hematology  (+) anemia ,   Anesthesia Other Findings DUB  Reproductive/Obstetrics                             Anesthesia Physical Anesthesia Plan  ASA: II  Anesthesia Plan: General   Post-op Pain Management:    Induction: Intravenous  PONV Risk Score and Plan: 3 and Midazolam, Scopolamine patch - Pre-op, Dexamethasone, Ondansetron and Treatment may vary due to age or medical condition  Airway Management Planned: Oral ETT  Additional Equipment:   Intra-op Plan:   Post-operative Plan: Extubation in OR  Informed Consent: I have reviewed the patients History and Physical, chart, labs and discussed the procedure including the risks, benefits and alternatives for the proposed anesthesia with the patient or authorized representative who has indicated his/her understanding and acceptance.   Dental advisory given  Plan Discussed with: CRNA  Anesthesia Plan Comments:         Anesthesia Quick Evaluation

## 2017-12-11 NOTE — Transfer of Care (Signed)
Immediate Anesthesia Transfer of Care Note  Patient: [name]  Procedure(s) Performed: LAPAROSCOPIC ASSISTED VAGINAL HYSTERECTOMY RIGHT SALPINGECTOMY LAPAROSCOPIC LYSIS OF ADHESIONS CYSTOSCOPY REPAIR VAGINAL LACERATION  Patient Location: PACU  Anesthesia Type:General  Level of Consciousness: sedated  Airway & Oxygen Therapy: Patient Spontanous Breathing and Patient connected to nasal cannula oxygen  Post-op Assessment: Report given to RN and Post -op Vital signs reviewed and stable  Post vital signs: stable  Last Vitals:  Vitals:   12/11/17 0617  BP: 114/74  Pulse: 66  Resp: 16  Temp: 36.8 C  SpO2: 100%    Last Pain:  Vitals:   12/11/17 0617  TempSrc: Oral      Patients Stated Pain Goal: 2 (83/35/82 5189)  Complications: No apparent anesthesia complications
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
## 2017-12-11 NOTE — Anesthesia Procedure Notes (Signed)
Procedure Name: Intubation Date/Time: 12/11/2017 7:42 AM Performed by: Ignacia Bayley, CRNA Pre-anesthesia Checklist: Patient identified, Emergency Drugs available, Suction available and Patient being monitored Patient Re-evaluated:Patient Re-evaluated prior to induction Oxygen Delivery Method: Circle system utilized Preoxygenation: Pre-oxygenation with 100% oxygen Induction Type: IV induction Ventilation: Mask ventilation without difficulty Laryngoscope Size: Miller and 2 Grade View: Grade I Tube type: Oral Number of attempts: 1 Airway Equipment and Method: Stylet Placement Confirmation: ETT inserted through vocal cords under direct vision,  positive ETCO2 and breath sounds checked- equal and bilateral Secured at: 20 cm Tube secured with: Tape Dental Injury: Teeth and Oropharynx as per pre-operative assessment

## 2017-12-11 NOTE — Op Note (Addendum)
12/11/2017  9:48 AM  PATIENT:  [name]  47 y.o. female  PRE-OPERATIVE DIAGNOSIS:  DUB, anemia  POST-OPERATIVE DIAGNOSIS:  DUBanemia, right hydrosalpinx, fibroid, and adhesions  PROCEDURE:  Procedure(s): LAPAROSCOPIC ASSISTED VAGINAL HYSTERECTOMY RIGHT SALPINGECTOMY LAPAROSCOPIC LYSIS OF ADHESIONS CYSTOSCOPY REPAIR VAGINAL LACERATION  SURGEON:  Surgeon(s) and Role:    * Artasia Thang, Wilhemina Cash, MD - Primary    * Lavonia Drafts, MD - Assisting    * Sloan Leiter, MD - Assisting   ANESTHESIA:   local and general  EBL:  400 mL   BLOOD ADMINISTERED:none  DRAINS: none   LOCAL MEDICATIONS USED:  MARCAINE     SPECIMEN:  Source of Specimen:  uterus, right oviduct  DISPOSITION OF SPECIMEN:  PATHOLOGY  COUNTS:  YES  TOURNIQUET:  * No tourniquets in log *  DICTATION: .Dragon Dictation  PLAN OF CARE: Admit for overnight observation  PATIENT DISPOSITION:  PACU - hemodynamically stable.   Delay start of Pharmacological VTE agent (>24hrs) due to surgical blood loss or risk of bleeding: not applicable   The risks, benefits, and alternatives of surgery were explained, accepted, and understood. Consents were signed. She was taken to the operating room and placed in the dorsal lithotomy position. When she was comfortable, general anesthesia was applied without complication. Her abdomen and vagina were prepped and draped in the usual sterile fashion. A bimanual exam revealed a 12 week size uterus, mobile, posterior fibroid appreciated , her adnexa were not palpable. A Hulka manipulator was placed. A Foley catheter was placed and a drained clear urine. Gloves were changed and attention was turned to the abdomen. 0.5% Marcaine was used to inject at all the incision sites prior to making an incision. A 62mm incision was made in the umbilicus. A varies needle was placed intraperitoneally. Low-flow CO2 was used to insufflate the abdomen to approximately 3 L. After an excellent  pneumoperitoneum was established a 5 mm trocar was placed. Laparoscopy confirmed correct placement. Under direct laparoscopic visualization I placed a 5 mm port in each lower quadrant. She was placed in the Trendelenburg position. The pelvis was inspected.  The right oviduct was not visible. The left ovary was small and densely adherent in the left ovarian fossa.  The right ovary was adherent to the lower part of the uterus, the right oviduct was swollen.   The round ligaments were ligated with the harmonic scalpel. A bladder flap was created. The uteroovarian ligament on each side was ligated.  I then moved to the vaginal approach. I used a solution of 30cc of 0.5% marcaine with 100 cc of normal saline to hydrodisect at the cervicovaginal junction. I made a circumferential incision at the site. The posterior peritoneum was entered sharply and a long weighted speculum was placed. 2-0 vicryl suture is used throughout unless otherwise specified.  I entered the anterior peritoneum as well. At this point the urine had a pink tinge. I then continued to separate the uterus from its pelvic attachments. The uterus was then removed after morcellating the fibroid out. Excellent hemostasis was maintained throughout. Cystoscopy revealed no tears in the bladder. There was a bruised looking area on the left, medial to the left ureter. I closed the vaginal cuff in a horizontal fashion with a 0 vicryl suture in a running, locking fashion. Gloves were changed and attention was turned back to the abdomen. The pelvis was inspected and noted to be hemostatic. The right oviduct was then excised. I changed the 5 mm port  on her left to a 10 mm port. The specimen was removed with an endopouch through the 10 mm port. There was a hemorrhagic cyst on her right ovary. The vaginal cuff was hemostatic as were the other pedicles. The ureters appeared  to be of normal size and showed peristalsis. The CO2 was allowed to escape from the abdomen. The  fascia at the 10 mm site was closed with a 0 Vicryl figure-of-eight suture. No defects were palpable. A subcuticular closure was done with 4-0 Vicryl at all of the skin incisions. Steri-Strips were placed across the incision.There was noted to be an laceration of her left labia majora. I repaired this with a 4-0 vicryl suture in a subcuticular fashion.  She was extubated and taken to the recovery room in stable condition.

## 2017-12-11 NOTE — Progress Notes (Signed)
Small amounts of blood noted on patient gown and sheets. Loose 4x4 gauze applied to monitor bleeding, 2hrs later continued oozing from umbilicus noted. Spoke with Dr Nehemiah Settle about constant oozing from surgical site. Instructed to place pressure dressing and continue monitoring. Dressing applied, pt tolerated well.

## 2017-12-11 NOTE — Anesthesia Postprocedure Evaluation (Signed)
Anesthesia Post Note  Patient: [name]  Procedure(s) Performed: LAPAROSCOPIC ASSISTED VAGINAL HYSTERECTOMY RIGHT SALPINGECTOMY LAPAROSCOPIC LYSIS OF ADHESIONS CYSTOSCOPY REPAIR VAGINAL LACERATION     Patient location during evaluation: PACU Anesthesia Type: General Level of consciousness: awake and alert Pain management: pain level controlled Vital Signs Assessment: post-procedure vital signs reviewed and stable Respiratory status: spontaneous breathing, nonlabored ventilation, respiratory function stable and patient connected to nasal cannula oxygen Cardiovascular status: blood pressure returned to baseline and stable Postop Assessment: no apparent nausea or vomiting Anesthetic complications: no    Last Vitals:  Vitals:   12/11/17 1159 12/11/17 1200  BP: 124/77 124/77  Pulse: 65 67  Resp: 16 16  Temp: 36.7 C 36.7 C  SpO2: 100% 100%    Last Pain:  Vitals:   12/11/17 1200  TempSrc:   PainSc: 0-No pain                 Kathy Wares P Larayah Clute

## 2017-12-12 ENCOUNTER — Encounter (HOSPITAL_COMMUNITY): Payer: Self-pay | Admitting: Obstetrics & Gynecology

## 2017-12-12 DIAGNOSIS — D649 Anemia, unspecified: Secondary | ICD-10-CM | POA: Diagnosis not present

## 2017-12-12 DIAGNOSIS — N736 Female pelvic peritoneal adhesions (postinfective): Secondary | ICD-10-CM | POA: Diagnosis not present

## 2017-12-12 DIAGNOSIS — N83201 Unspecified ovarian cyst, right side: Secondary | ICD-10-CM | POA: Diagnosis not present

## 2017-12-12 DIAGNOSIS — D250 Submucous leiomyoma of uterus: Secondary | ICD-10-CM | POA: Diagnosis not present

## 2017-12-12 DIAGNOSIS — N8301 Follicular cyst of right ovary: Secondary | ICD-10-CM | POA: Diagnosis not present

## 2017-12-12 DIAGNOSIS — N7011 Chronic salpingitis: Secondary | ICD-10-CM | POA: Diagnosis not present

## 2017-12-12 DIAGNOSIS — N879 Dysplasia of cervix uteri, unspecified: Secondary | ICD-10-CM | POA: Diagnosis not present

## 2017-12-12 DIAGNOSIS — N938 Other specified abnormal uterine and vaginal bleeding: Secondary | ICD-10-CM | POA: Diagnosis not present

## 2017-12-12 LAB — TYPE AND SCREEN
ABO/RH(D): A POS
ANTIBODY SCREEN: NEGATIVE
UNIT DIVISION: 0
Unit division: 0

## 2017-12-12 LAB — CBC
HEMATOCRIT: 29.2 % — AB (ref 36.0–46.0)
HEMOGLOBIN: 8.7 g/dL — AB (ref 12.0–15.0)
MCH: 22.7 pg — AB (ref 26.0–34.0)
MCHC: 29.8 g/dL — AB (ref 30.0–36.0)
MCV: 76 fL — ABNORMAL LOW (ref 78.0–100.0)
Platelets: 131 10*3/uL — ABNORMAL LOW (ref 150–400)
RBC: 3.84 MIL/uL — ABNORMAL LOW (ref 3.87–5.11)
RDW: 27.8 % — AB (ref 11.5–15.5)
WBC: 14.3 10*3/uL — ABNORMAL HIGH (ref 4.0–10.5)

## 2017-12-12 LAB — BPAM RBC
Blood Product Expiration Date: 201903042359
Blood Product Expiration Date: 201903042359
ISSUE DATE / TIME: 201902051236
ISSUE DATE / TIME: 201902051513
Unit Type and Rh: 6200
Unit Type and Rh: 6200

## 2017-12-12 MED ORDER — OXYCODONE-ACETAMINOPHEN 5-325 MG PO TABS: 1.0000 | ORAL_TABLET | Freq: Four times a day (QID) | ORAL | 0 refills | Status: DC | PRN

## 2017-12-12 MED ORDER — IBUPROFEN 100 MG/5ML PO SUSP: 800.0000 mg | Freq: Three times a day (TID) | ORAL | 1 refills | Status: DC | PRN

## 2017-12-12 MED ORDER — IBUPROFEN 100 MG/5ML PO SUSP
800.0000 mg | Freq: Three times a day (TID) | ORAL | Status: DC | PRN
  Filled 2017-12-12: qty 40

## 2017-12-12 NOTE — Discharge Summary (Signed)
Physician Discharge Summary  Patient ID: [name] MRN: [id number] DOB/AGE: 11/21/1970 47 y.o.  Admit date: 12/11/2017 Discharge date: 12/12/2017  Admission Diagnoses: symptomatic fibroid, anemia  Discharge Diagnoses: same  Active Problems:   Post-operative state   Discharged Condition: good  Hospital Course: She underwent an uncomplicated LAVH/Right Salpingectomy/cystoscopy. Preoperatively her hbg was 9. I explained to her pre op that I would expect her to have a post op hbg of 7 and she agreed to a transfusion of 2 units PRBC. By POD #1 she was having flatus, ambulating, voiding, tolerating po well, and expressed her desire to go home.  Consults: None  Significant Diagnostic Studies: labs: Her post op hbg was 8.7  Treatments: surgery: as above  Discharge Exam: Blood pressure 111/73, pulse 64, temperature 99 F (37.2 C), temperature source Oral, resp. rate 16, SpO2 99 %. General appearance: alert Resp: clear to auscultation bilaterally Cardio: regular rate and rhythm, S1, S2 normal, no murmur, click, rub or gallop GI: soft, non-tender; bowel sounds normal; no masses,  no organomegaly Incision/Wound: all incisions clean, dry, intact  Disposition: 01-Home or Self Care   Allergies as of 12/12/2017   No Known Allergies     Medication List    STOP taking these medications   ferrous sulfate 325 (65 FE) MG tablet Commonly known as:  FERROUSUL     TAKE these medications   docusate sodium 100 MG capsule Commonly known as:  COLACE Take 1 capsule (100 mg total) by mouth 2 (two) times daily as needed.   ibuprofen 100 MG/5ML suspension Commonly known as:  ADVIL,MOTRIN Take 40 mLs (800 mg total) by mouth every 8 (eight) hours as needed for mild pain.   oxyCODONE-acetaminophen 5-325 MG tablet Commonly known as:  PERCOCET/ROXICET Take 1-2 tablets by mouth every 6 (six) hours as needed.   oxyCODONE-acetaminophen 5-325 MG tablet Commonly known as:  PERCOCET/ROXICET Take  1-2 tablets by mouth every 6 (six) hours as needed.      Follow-up Information    Emily Filbert, MD. Schedule an appointment as soon as possible for a visit in 4 week(s).   Specialty:  Obstetrics and Gynecology Contact information: [place] [id number] [phone]           Signed: Emily Filbert 12/12/2017, 10:10 AM

## 2017-12-12 NOTE — Progress Notes (Signed)
Discharge instructions/ post op surgical pamphlet reviewed and follow up scheduled by patient. Pt verbalizes understanding.

## 2017-12-12 NOTE — Discharge Instructions (Signed)
Vaginal Hysterectomy, Care After °Refer to this sheet in the next few weeks. These instructions provide you with information about caring for yourself after your procedure. Your health care provider may also give you more specific instructions. Your treatment has been planned according to current medical practices, but problems sometimes occur. Call your health care provider if you have any problems or questions after your procedure. °What can I expect after the procedure? °After the procedure, it is common to have: °· Pain. °· Soreness and numbness in your incision areas. °· Vaginal bleeding and discharge. °· Constipation. °· Temporary problems emptying the bladder. °· Feelings of sadness or other emotions. ° °Follow these instructions at home: °Medicines °· Take over-the-counter and prescription medicines only as told by your health care provider. °· If you were prescribed an antibiotic medicine, take it as told by your health care provider. Do not stop taking the antibiotic even if you start to feel better. °· Do not drive or operate heavy machinery while taking prescription pain medicine. °Activity °· Return to your normal activities as told by your health care provider. Ask your health care provider what activities are safe for you. °· Get regular exercise as told by your health care provider. You may be told to take short walks every day and go farther each time. °· Do not lift anything that is heavier than 10 lb (4.5 kg). °General instructions ° °· Do not put anything in your vagina for 6 weeks after your surgery or as told by your health care provider. This includes tampons and douches. °· Do not have sex until your health care provider says you can. °· Do not take baths, swim, or use a hot tub until your health care provider approves. °· Drink enough fluid to keep your urine clear or pale yellow. °· Do not drive for 24 hours if you were given a sedative. °· Keep all follow-up visits as told by your health  care provider. This is important. °Contact a health care provider if: °· Your pain medicine is not helping. °· You have a fever. °· You have redness, swelling, or pain at your incision site. °· You have blood, pus, or a bad-smelling discharge from your vagina. °· You continue to have difficulty urinating. °Get help right away if: °· You have severe abdominal or back pain. °· You have heavy bleeding from your vagina. °· You have chest pain or shortness of breath. °This information is not intended to replace advice given to you by your health care provider. Make sure you discuss any questions you have with your health care provider. °Document Released: 02/14/2016 Document Revised: 03/30/2016 Document Reviewed: 11/07/2015 °Elsevier Interactive Patient Education © 2018 Elsevier Inc. ° °

## 2017-12-17 ENCOUNTER — Encounter: Payer: Self-pay | Admitting: Nurse Practitioner

## 2017-12-17 ENCOUNTER — Encounter: Payer: Self-pay | Admitting: Obstetrics & Gynecology

## 2017-12-17 ENCOUNTER — Telehealth: Payer: Self-pay | Admitting: *Deleted

## 2017-12-17 ENCOUNTER — Ambulatory Visit: Payer: Medicare HMO | Admitting: Nurse Practitioner

## 2017-12-17 VITALS — BP 104/73 | HR 97 | Temp 99.6°F

## 2017-12-17 DIAGNOSIS — S301XXA Contusion of abdominal wall, initial encounter: Secondary | ICD-10-CM | POA: Insufficient documentation

## 2017-12-17 MED ORDER — IBUPROFEN 100 MG/5ML PO SUSP: 800.0000 mg | Freq: Three times a day (TID) | ORAL | 1 refills | Status: DC | PRN

## 2017-12-17 NOTE — Patient Instructions (Signed)
May use Ice pack in a thin towel to the area three times a day for healing.

## 2017-12-17 NOTE — Telephone Encounter (Signed)
Angelle called today and left message she has some swelling on left side incision and body and is concerned and wants a call back.

## 2017-12-17 NOTE — Telephone Encounter (Signed)
Fumie has also sent 2 MyChart messages re: same issue. I called [name] and she states she is noticeably swollen on the left side of her abdomen. States incisions are ok, is voiding ok, is having bowel movements ok, denies fever.States pain is ok with her pain meds but that the swelling just started today. I offered her an office visit tonight at 6:40 which she agreed to.

## 2017-12-17 NOTE — Progress Notes (Signed)
Had lap assisted vag hyst last Tues. Having normal BMs and urinating ok. Noticed swelling on L side of abd yesterday that continues. Incisions clean and dry. No vag bleeding
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
## 2017-12-18 ENCOUNTER — Encounter: Payer: Self-pay | Admitting: Nurse Practitioner

## 2017-12-24 ENCOUNTER — Ambulatory Visit (INDEPENDENT_AMBULATORY_CARE_PROVIDER_SITE_OTHER): Payer: Self-pay | Admitting: Obstetrics & Gynecology

## 2017-12-24 ENCOUNTER — Encounter: Payer: Self-pay | Admitting: Obstetrics & Gynecology

## 2017-12-24 VITALS — BP 129/92 | HR 72 | Ht 68.0 in | Wt 120.0 lb

## 2017-12-24 DIAGNOSIS — Z9889 Other specified postprocedural states: Secondary | ICD-10-CM

## 2017-12-24 NOTE — Progress Notes (Signed)
   Subjective:    Patient ID: [name], female    DOB: 1971-09-28, 47 y.o.   MRN: [id number]  HPI 47 yo married lady here for a follow up after a MAvisit for a hematoma after a LAVH on 12-11-17. She reports that it is much improved. She is having daily BMs since surgery. Prior to surgery, she would generally have a BV q 3days. She is happy with this change. She reports normal bladder function. She has no complaints today.   Review of Systems     Objective:   Physical Exam Well nourished, well hydrated Black female, no apparent distress Breathing, conversing, and ambulating normally Abd- benign All incisions healing well Normoactive bowel sounds No evidence of a hematoma now      Assessment & Plan:  Post op 2 weeks s/p LAVH, right salpingectomy, lysis of adhesions via laparoscopy Come back for 6 week post op visit (already scheduled)

## 2018-01-08 ENCOUNTER — Ambulatory Visit: Payer: Medicare HMO | Admitting: Obstetrics & Gynecology

## 2018-01-08 ENCOUNTER — Encounter: Payer: Self-pay | Admitting: Obstetrics & Gynecology

## 2018-01-08 VITALS — BP 124/95 | HR 79 | Temp 98.5°F | Wt 117.2 lb

## 2018-01-08 DIAGNOSIS — Z9889 Other specified postprocedural states: Secondary | ICD-10-CM

## 2018-01-08 NOTE — Progress Notes (Signed)
   Subjective:    Patient ID: [name], female    DOB: Jun 23, 1971, 47 y.o.   MRN: [id number]  HPI This 47 year old lady is 4 weeks post op s/p LAVH/Right salpingectomy/lysis of adhesions/cystoscopy. She reports normal bowel and bladder function. She has not had sex since surgery. She has no complaints.   Review of Systems     Objective:   Physical Exam Breathing, conversing, and ambulating normally Well nourished, well hydrated Black female, no apparent distress Abd- benign Incisions- healed well Cuff and bimanual exam - normal     Assessment & Plan:  Post op doing well Come back 1 year/prn sooner

## 2018-01-31 ENCOUNTER — Encounter: Payer: Self-pay | Admitting: Obstetrics & Gynecology

## 2018-03-14 ENCOUNTER — Ambulatory Visit (INDEPENDENT_AMBULATORY_CARE_PROVIDER_SITE_OTHER): Payer: 59 | Admitting: Family Medicine

## 2018-03-14 ENCOUNTER — Encounter: Payer: Self-pay | Admitting: Family Medicine

## 2018-03-14 VITALS — BP 130/90 | HR 85 | Ht 67.25 in | Wt 122.8 lb

## 2018-03-14 DIAGNOSIS — Z7689 Persons encountering health services in other specified circumstances: Secondary | ICD-10-CM

## 2018-03-14 DIAGNOSIS — F17200 Nicotine dependence, unspecified, uncomplicated: Secondary | ICD-10-CM | POA: Diagnosis not present

## 2018-03-14 DIAGNOSIS — F419 Anxiety disorder, unspecified: Secondary | ICD-10-CM

## 2018-03-14 DIAGNOSIS — R030 Elevated blood-pressure reading, without diagnosis of hypertension: Secondary | ICD-10-CM | POA: Diagnosis not present

## 2018-03-14 DIAGNOSIS — F17210 Nicotine dependence, cigarettes, uncomplicated: Secondary | ICD-10-CM

## 2018-03-14 DIAGNOSIS — Z862 Personal history of diseases of the blood and blood-forming organs and certain disorders involving the immune mechanism: Secondary | ICD-10-CM | POA: Diagnosis not present
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
## 2018-03-14 NOTE — Patient Instructions (Addendum)
Your blood pressure is 130/90. Goal BP is <130/80. You are not far off from the goal.   I recommend you check your BP every now and then and keep a record.   You seem ready to stop smoking and I encourage you to do this.   Follow up with me in 8 weeks for a fasting physical exam. Nothing to eat or drink except water for at least 6 hours.   You can call to schedule your appointment with the Counselor. A few offices are listed below for you to call.   [facility]  [address], [place], [place] [address] Phone: [phone]  [facility] [address] [place], [place] [address] [phone]  [facility] [address], [place] [address] [place] [address] [place], [place] [address]  Phone: [phone]         [place] stands for "Dietary Approaches to Stop Hypertension." The DASH eating plan is a healthy eating plan that has been shown to reduce high blood pressure (hypertension). It may also reduce your risk for type 2 diabetes, heart disease, and stroke. The DASH eating plan may also help with weight loss. What are tips for following this plan? General guidelines  Avoid eating more than 2,300 mg (milligrams) of salt (sodium) a day. If you have hypertension, you may need to reduce your sodium intake to 1,500 mg a day.  Limit alcohol intake to no more than 1 drink a day for nonpregnant women and 2 drinks a day for men. One drink equals 12 oz of beer, 5 oz of wine, or 1 oz of hard liquor.  Work with your health care provider to maintain a healthy body weight or to lose weight. Ask what an ideal weight is for you.  Get at least 30 minutes of exercise that causes your heart to beat faster (aerobic exercise) most days of the week. Activities may include walking, swimming, or biking.  Work with your health care provider or diet  and nutrition specialist (dietitian) to adjust your eating plan to your individual calorie needs. Reading food labels  Check food labels for the amount of sodium per serving. Choose foods with less than 5 percent of the Daily Value of sodium. Generally, foods with less than 300 mg of sodium per serving fit into this eating plan.  To find whole grains, look for the word "whole" as the first word in the ingredient list. Shopping  Buy products labeled as "low-sodium" or "no salt added."  Buy fresh foods. Avoid canned foods and premade or frozen meals. Cooking  Avoid adding salt when cooking. Use salt-free seasonings or herbs instead of table salt or sea salt. Check with your health care provider or pharmacist before using salt substitutes.  Do not fry foods. Cook foods using healthy methods such as baking, boiling, grilling, and broiling instead.  Cook with heart-healthy oils, such as olive, canola, soybean, or sunflower oil. Meal planning   Eat a balanced diet that includes: ? 5 or more servings of fruits and vegetables each day. At each meal, try to fill half of your plate with fruits and vegetables. ? Up to 6-8 servings of whole grains each day. ? Less than 6 oz of lean meat, poultry, or fish each day. A 3-oz serving of meat is about the same size as a deck of cards. One egg equals 1  oz. ? 2 servings of low-fat dairy each day. ? A serving of nuts, seeds, or beans 5 times each week. ? Heart-healthy fats. Healthy fats called Omega-3 fatty acids are found in foods such as flaxseeds and coldwater fish, like sardines, salmon, and mackerel.  Limit how much you eat of the following: ? Canned or prepackaged foods. ? Food that is high in trans fat, such as fried foods. ? Food that is high in saturated fat, such as fatty meat. ? Sweets, desserts, sugary drinks, and other foods with added sugar. ? Full-fat dairy products.  Do not salt foods before eating.  Try to eat at least 2 vegetarian  meals each week.  Eat more home-cooked food and less restaurant, buffet, and fast food.  When eating at a restaurant, ask that your food be prepared with less salt or no salt, if possible. What foods are recommended? The items listed may not be a complete list. Talk with your dietitian about what dietary choices are best for you. Grains Whole-grain or whole-wheat bread. Whole-grain or whole-wheat pasta. Brown rice. [place]row. Bulgur. Whole-grain and low-sodium cereals. Pita bread. Low-fat, low-sodium crackers. Whole-wheat flour tortillas. Vegetables Fresh or frozen vegetables (raw, steamed, roasted, or grilled). Low-sodium or reduced-sodium tomato and vegetable juice. Low-sodium or reduced-sodium tomato sauce and tomato paste. Low-sodium or reduced-sodium canned vegetables. Fruits All fresh, dried, or frozen fruit. Canned fruit in natural juice (without added sugar). Meat and other protein foods Skinless chicken or [place]. Ground chicken or [place]. Pork with fat trimmed off. Fish and seafood. Egg whites. Dried beans, peas, or lentils. Unsalted nuts, nut butters, and seeds. Unsalted canned beans. Lean cuts of beef with fat trimmed off. Low-sodium, lean deli meat. Dairy Low-fat (1%) or fat-free (skim) milk. Fat-free, low-fat, or reduced-fat cheeses. Nonfat, low-sodium ricotta or cottage cheese. Low-fat or nonfat yogurt. Low-fat, low-sodium cheese. Fats and oils Soft margarine without trans fats. Vegetable oil. Low-fat, reduced-fat, or light mayonnaise and salad dressings (reduced-sodium). Canola, safflower, olive, soybean, and sunflower oils. Avocado. Seasoning and other foods Herbs. Spices. Seasoning mixes without salt. Unsalted popcorn and pretzels. Fat-free sweets. What foods are not recommended? The items listed may not be a complete list. Talk with your dietitian about what dietary choices are best for you. Grains Baked goods made with fat, such as croissants, muffins, or some  breads. Dry pasta or rice meal packs. Vegetables Creamed or fried vegetables. Vegetables in a cheese sauce. Regular canned vegetables (not low-sodium or reduced-sodium). Regular canned tomato sauce and paste (not low-sodium or reduced-sodium). Regular tomato and vegetable juice (not low-sodium or reduced-sodium). Angie Fava. Olives. Fruits Canned fruit in a light or heavy syrup. Fried fruit. Fruit in cream or butter sauce. Meat and other protein foods Fatty cuts of meat. Ribs. Fried meat. Berniece Salines. Sausage. Bologna and other processed lunch meats. Salami. Fatback. Hotdogs. Bratwurst. Salted nuts and seeds. Canned beans with added salt. Canned or smoked fish. Whole eggs or egg yolks. Chicken or [place] with skin. Dairy Whole or 2% milk, cream, and half-and-half. Whole or full-fat cream cheese. Whole-fat or sweetened yogurt. Full-fat cheese. Nondairy creamers. Whipped toppings. Processed cheese and cheese spreads. Fats and oils Butter. Stick margarine. Lard. Shortening. Ghee. Bacon fat. Tropical oils, such as coconut, palm kernel, or palm oil. Seasoning and other foods Salted popcorn and pretzels. Onion salt, garlic salt, seasoned salt, table salt, and sea salt. Worcestershire sauce. Tartar sauce. Barbecue sauce. Teriyaki sauce. Soy sauce, including reduced-sodium. Steak sauce. Canned and packaged gravies. Fish sauce. Oyster sauce.  Cocktail sauce. Horseradish that you find on the shelf. Ketchup. Mustard. Meat flavorings and tenderizers. Bouillon cubes. Hot sauce and Tabasco sauce. Premade or packaged marinades. Premade or packaged taco seasonings. Relishes. Regular salad dressings. Where to find more information:  National Heart, Lung, and [place]: https://wilson-eaton.com/  American Heart Association: www.heart.org Summary  The DASH eating plan is a healthy eating plan that has been shown to reduce high blood pressure (hypertension). It may also reduce your risk for type 2 diabetes, heart disease, and  stroke.  With the DASH eating plan, you should limit salt (sodium) intake to 2,300 mg a day. If you have hypertension, you may need to reduce your sodium intake to 1,500 mg a day.  When on the DASH eating plan, aim to eat more fresh fruits and vegetables, whole grains, lean proteins, low-fat dairy, and heart-healthy fats.  Work with your health care provider or diet and nutrition specialist (dietitian) to adjust your eating plan to your individual calorie needs. This information is not intended to replace advice given to you by your health care provider. Make sure you discuss any questions you have with your health care provider. Document Released: 10/12/2011 Document Revised: 10/16/2016 Document Reviewed: 10/16/2016 Elsevier Interactive Patient Education  [employer].

## 2018-03-15 LAB — CBC WITH DIFFERENTIAL/PLATELET
BASOS ABS: 0.1 10*3/uL (ref 0.0–0.2)
Basos: 1 %
EOS (ABSOLUTE): 0.1 10*3/uL (ref 0.0–0.4)
EOS: 1 %
HEMOGLOBIN: 13.1 g/dL (ref 11.1–15.9)
Hematocrit: 41.4 % (ref 34.0–46.6)
IMMATURE GRANULOCYTES: 0 %
Immature Grans (Abs): 0 10*3/uL (ref 0.0–0.1)
Lymphocytes Absolute: 1.6 10*3/uL (ref 0.7–3.1)
Lymphs: 26 %
MCH: 26.9 pg (ref 26.6–33.0)
MCHC: 31.6 g/dL (ref 31.5–35.7)
MCV: 85 fL (ref 79–97)
MONOCYTES: 9 %
Monocytes Absolute: 0.5 10*3/uL (ref 0.1–0.9)
NEUTROS PCT: 63 %
Neutrophils Absolute: 3.9 10*3/uL (ref 1.4–7.0)
Platelets: 280 10*3/uL (ref 150–379)
RBC: 4.87 x10E6/uL (ref 3.77–5.28)
RDW: 17.4 % — AB (ref 12.3–15.4)
WBC: 6.1 10*3/uL (ref 3.4–10.8)

## 2018-03-15 LAB — COMPREHENSIVE METABOLIC PANEL
ALBUMIN: 4.7 g/dL (ref 3.5–5.5)
ALT: 7 IU/L (ref 0–32)
AST: 15 IU/L (ref 0–40)
Albumin/Globulin Ratio: 1.6 (ref 1.2–2.2)
Alkaline Phosphatase: 77 IU/L (ref 39–117)
BUN/Creatinine Ratio: 10 (ref 9–23)
BUN: 6 mg/dL (ref 6–24)
Bilirubin Total: 0.7 mg/dL (ref 0.0–1.2)
CALCIUM: 9.6 mg/dL (ref 8.7–10.2)
CO2: 24 mmol/L (ref 20–29)
CREATININE: 0.58 mg/dL (ref 0.57–1.00)
Chloride: 102 mmol/L (ref 96–106)
GFR calc Af Amer: 128 mL/min/{1.73_m2} (ref >59)
GFR, EST NON AFRICAN AMERICAN: 111 mL/min/{1.73_m2} (ref >59)
GLOBULIN, TOTAL: 3 g/dL (ref 1.5–4.5)
GLUCOSE: 74 mg/dL (ref 65–99)
Potassium: 4.8 mmol/L (ref 3.5–5.2)
Sodium: 138 mmol/L (ref 134–144)
Total Protein: 7.7 g/dL (ref 6.0–8.5)

## 2018-03-15 LAB — TSH: TSH: 0.679 u[IU]/mL (ref 0.450–4.500)

## 2018-04-15 DIAGNOSIS — F4310 Post-traumatic stress disorder, unspecified: Secondary | ICD-10-CM | POA: Diagnosis not present

## 2018-04-15 DIAGNOSIS — F411 Generalized anxiety disorder: Secondary | ICD-10-CM | POA: Diagnosis not present

## 2018-04-25 DIAGNOSIS — F411 Generalized anxiety disorder: Secondary | ICD-10-CM | POA: Diagnosis not present

## 2018-04-25 DIAGNOSIS — F4312 Post-traumatic stress disorder, chronic: Secondary | ICD-10-CM | POA: Diagnosis not present

## 2018-04-25 DIAGNOSIS — F4310 Post-traumatic stress disorder, unspecified: Secondary | ICD-10-CM | POA: Diagnosis not present

## 2018-04-25 DIAGNOSIS — F5101 Primary insomnia: Secondary | ICD-10-CM | POA: Diagnosis not present

## 2018-04-25 DIAGNOSIS — F332 Major depressive disorder, recurrent severe without psychotic features: Secondary | ICD-10-CM | POA: Diagnosis not present

## 2018-04-29 DIAGNOSIS — F4310 Post-traumatic stress disorder, unspecified: Secondary | ICD-10-CM | POA: Diagnosis not present

## 2018-04-29 DIAGNOSIS — F411 Generalized anxiety disorder: Secondary | ICD-10-CM | POA: Diagnosis not present

## 2018-05-06 DIAGNOSIS — F4310 Post-traumatic stress disorder, unspecified: Secondary | ICD-10-CM | POA: Diagnosis not present

## 2018-05-06 DIAGNOSIS — F411 Generalized anxiety disorder: Secondary | ICD-10-CM | POA: Diagnosis not present

## 2018-05-13 DIAGNOSIS — F4310 Post-traumatic stress disorder, unspecified: Secondary | ICD-10-CM | POA: Diagnosis not present

## 2018-05-13 DIAGNOSIS — F411 Generalized anxiety disorder: Secondary | ICD-10-CM | POA: Diagnosis not present

## 2018-05-14 NOTE — Progress Notes (Signed)
[name] is a 47 y.o. female who presents for annual wellness visit and follow-up on chronic medical conditions.  She has the following concerns:  No concerns. Doing well.   There is no immunization history on file for this patient. Last Pap smear:  Hysterectomy on 12/11/2017 Last mammogram: December 2018  Last colonoscopy: never Last DEXA: never Dentist:  [facility] Op[facility]: in years  Exercise: ymca twice a week  She is working on stopping smoking. Using 1-800-quit now. She is going to [facility] smoking cessation classes.   Other doctors caring for patient include: Counselor/therapist- [facility] Dr. Hulan Fray is her OB/GYN    Depression screen:  See questionnaire below.  Depression screen [facility] 2/9 05/15/2018 03/14/2018  Decreased Interest 0 0  Down, Depressed, Hopeless 0 0  PHQ - 2 Score 0 0    Fall Risk Screen: see questionnaire below. Fall Risk  05/15/2018 03/14/2018  Falls in the past year? No No    ADL screen:  See questionnaire below Functional Status Survey: Is the patient deaf or have difficulty hearing?: No Does the patient have difficulty seeing, even when wearing glasses/contacts?: No Does the patient have difficulty concentrating, remembering, or making decisions?: No Does the patient have difficulty walking or climbing stairs?: No Does the patient have difficulty dressing or bathing?: No Does the patient have difficulty doing errands alone such as visiting a doctor's office or shopping?: No   End of Life Discussion:  Patient does not have a living will and medical power of attorney. Forms provided. MOST form filled out.   Review of Systems Constitutional: -fever, -chills, -sweats, -unexpected weight change, -anorexia, -fatigue Allergy: -sneezing, -itching, -congestion Dermatology: denies changing moles, rash, lumps, new worrisome lesions ENT: -runny nose, -ear pain, -sore throat, -hoarseness, -sinus pain, -teeth pain, -tinnitus, -hearing  loss Cardiology:  -chest pain, -palpitations, -edema, -orthopnea, -paroxysmal nocturnal dyspnea Respiratory: -cough, -shortness of breath, -dyspnea on exertion, -wheezing, -hemoptysis Gastroenterology: -abdominal pain, -nausea, -vomiting, -diarrhea, -constipation, -blood in stool, -changes in bowel movement, -dysphagia Hematology: -bleeding or bruising problems Musculoskeletal: -arthralgias, -myalgias, -joint swelling, -back pain, -neck pain, -cramping, -gait changes Ophthalmology: -vision changes, -eye redness, -itching, -discharge Urology: -dysuria, -difficulty urinating, -hematuria, -urinary frequency, -urgency, incontinence Neurology: -headache, -weakness, -tingling, -numbness, -speech abnormality, -memory loss, -falls, -dizziness Psychology:  -depressed mood, -agitation, -sleep problems    PHYSICAL EXAM:  BP 120/80   Pulse 70   Ht 5\' 8"  (1.727 m)   Wt 118 lb 6.4 oz (53.7 kg)   LMP 12/03/2017   BMI 18.00 kg/m   General Appearance: Alert, cooperative, no distress, appears stated age Head: Normocephalic, without obvious abnormality, atraumatic Eyes: PERRL, conjunctiva/corneas clear, EOM's intact, fundi benign Ears: Normal TM's and external ear canals Nose: Nares normal, mucosa normal, no drainage or sinus   tenderness Throat: Lips, mucosa, and tongue normal; teeth and gums normal Neck: Supple, no lymphadenopathy; thyroid: no enlargement/tenderness/nodules; no carotid bruit or JVD Back: Spine nontender, no curvature, ROM normal, no CVA tenderness Lungs: Clear to auscultation bilaterally without wheezes, rales or ronchi; respirations unlabored Chest Wall: No tenderness or deformity Heart: Regular rate and rhythm, S1 and S2 normal, no murmur, rub or gallop Breast Exam: OB/GYN. Mammogram up to date Abdomen: Soft, non-tender, nondistended, normoactive bowel sounds, no masses, no hepatosplenomegaly Genitalia: OB/GYN Extremities: No clubbing, cyanosis or edema Pulses: 2+ and  symmetric all extremities Skin: Skin color, texture, turgor normal, no rashes or lesions Lymph nodes: Cervical, supraclavicular, and axillary nodes normal Neurologic: CNII-XII intact, normal strength, sensation  and gait; reflexes 2+ and symmetric throughout Psych: Normal mood, affect, hygiene and grooming.  ASSESSMENT/PLAN: Medicare annual wellness visit, subsequent  Advanced directives, counseling/discussion  Screening for lipid disorders - Plan: Lipid panel  BP is normal today.  Anxiety improving with counseling and exercise.  Counseling on advance directives and MOST form filled out with her today.  She is doing well. No falls or depression.   Counseling on immunizations.  Prescription given for Tdap. She will take this to her pharmacy.  Reviewed recent labs with patient. Will check lipids today.  Continue with smoking cessation counseling with [facility] and 1-800 quit now.  Follow up pending lipid panel or as needed.    Discussed monthly self breast exams and yearly mammograms; at least 30 minutes of aerobic activity at least 5 days/week and weight-bearing exercise 2x/week; proper sunscreen use reviewed; healthy diet, including goals of calcium and vitamin D intake and alcohol recommendations (less than or equal to 1 drink/day) reviewed; regular seatbelt use; changing batteries in smoke detectors.  Immunization recommendations discussed.  Colonoscopy recommendations reviewed   Medicare Attestation I have personally reviewed: The patient's medical and social history Their use of alcohol, tobacco or illicit drugs Their current medications and supplements The patient's functional ability including ADLs,fall risks, home safety risks, cognitive, and hearing and visual impairment Diet and physical activities Evidence for depression or mood disorders  The patient's weight, height, and BMI have been recorded in the chart.  I have made referrals, counseling, and provided education to  the patient based on review of the above and I have provided the patient with a written personalized care plan for preventive services.     Harland Dingwall, NP-C   05/15/2018

## 2018-05-15 ENCOUNTER — Encounter: Payer: Self-pay | Admitting: Family Medicine

## 2018-05-15 ENCOUNTER — Ambulatory Visit (INDEPENDENT_AMBULATORY_CARE_PROVIDER_SITE_OTHER): Payer: Medicare HMO | Admitting: Family Medicine

## 2018-05-15 VITALS — BP 120/80 | HR 70 | Ht 68.0 in | Wt 118.4 lb

## 2018-05-15 DIAGNOSIS — E7800 Pure hypercholesterolemia, unspecified: Secondary | ICD-10-CM | POA: Diagnosis not present

## 2018-05-15 DIAGNOSIS — Z13220 Encounter for screening for lipoid disorders: Secondary | ICD-10-CM | POA: Diagnosis not present

## 2018-05-15 DIAGNOSIS — Z7189 Other specified counseling: Secondary | ICD-10-CM

## 2018-05-15 DIAGNOSIS — Z0000 Encounter for general adult medical examination without abnormal findings: Secondary | ICD-10-CM | POA: Diagnosis not present

## 2018-05-15 LAB — LIPID PANEL
CHOL/HDL RATIO: 4 ratio (ref 0.0–4.4)
CHOLESTEROL TOTAL: 158 mg/dL (ref 100–199)
HDL: 40 mg/dL (ref >39)
LDL Calculated: 107 mg/dL — ABNORMAL HIGH (ref 0–99)
TRIGLYCERIDES: 55 mg/dL (ref 0–149)
VLDL Cholesterol Cal: 11 mg/dL (ref 5–40)

## 2018-05-15 NOTE — Patient Instructions (Addendum)
Congratulations on make healthy lifestyle changes. Continue exercising and working on stopping smoking.   Your BP today is normal at 120/80.   We will call you with your lab results.   Take the prescription for Tetanus, Diptheria, and Pertussis (Tdap) to your pharmacy.

## 2018-05-20 DIAGNOSIS — F411 Generalized anxiety disorder: Secondary | ICD-10-CM | POA: Diagnosis not present

## 2018-05-20 DIAGNOSIS — F4310 Post-traumatic stress disorder, unspecified: Secondary | ICD-10-CM | POA: Diagnosis not present

## 2018-05-27 DIAGNOSIS — F5101 Primary insomnia: Secondary | ICD-10-CM | POA: Diagnosis not present

## 2018-05-27 DIAGNOSIS — F4310 Post-traumatic stress disorder, unspecified: Secondary | ICD-10-CM | POA: Diagnosis not present

## 2018-05-27 DIAGNOSIS — F4312 Post-traumatic stress disorder, chronic: Secondary | ICD-10-CM | POA: Diagnosis not present

## 2018-05-27 DIAGNOSIS — F332 Major depressive disorder, recurrent severe without psychotic features: Secondary | ICD-10-CM | POA: Diagnosis not present

## 2018-05-27 DIAGNOSIS — F411 Generalized anxiety disorder: Secondary | ICD-10-CM | POA: Diagnosis not present

## 2018-06-03 DIAGNOSIS — F4310 Post-traumatic stress disorder, unspecified: Secondary | ICD-10-CM | POA: Diagnosis not present

## 2018-06-03 DIAGNOSIS — F411 Generalized anxiety disorder: Secondary | ICD-10-CM | POA: Diagnosis not present

## 2018-06-10 DIAGNOSIS — F411 Generalized anxiety disorder: Secondary | ICD-10-CM | POA: Diagnosis not present

## 2018-06-10 DIAGNOSIS — F332 Major depressive disorder, recurrent severe without psychotic features: Secondary | ICD-10-CM | POA: Diagnosis not present

## 2018-06-10 DIAGNOSIS — F5101 Primary insomnia: Secondary | ICD-10-CM | POA: Diagnosis not present

## 2018-06-10 DIAGNOSIS — F4312 Post-traumatic stress disorder, chronic: Secondary | ICD-10-CM | POA: Diagnosis not present

## 2018-06-17 DIAGNOSIS — F4310 Post-traumatic stress disorder, unspecified: Secondary | ICD-10-CM | POA: Diagnosis not present

## 2018-06-17 DIAGNOSIS — F411 Generalized anxiety disorder: Secondary | ICD-10-CM | POA: Diagnosis not present

## 2018-06-20 ENCOUNTER — Encounter: Payer: Self-pay | Admitting: Family Medicine

## 2018-06-20 DIAGNOSIS — E7800 Pure hypercholesterolemia, unspecified: Secondary | ICD-10-CM

## 2018-06-20 HISTORY — DX: Pure hypercholesterolemia, unspecified: E78.00

## 2018-06-24 DIAGNOSIS — F4310 Post-traumatic stress disorder, unspecified: Secondary | ICD-10-CM | POA: Diagnosis not present

## 2018-06-24 DIAGNOSIS — F411 Generalized anxiety disorder: Secondary | ICD-10-CM | POA: Diagnosis not present

## 2018-07-15 DIAGNOSIS — F411 Generalized anxiety disorder: Secondary | ICD-10-CM | POA: Diagnosis not present

## 2018-07-15 DIAGNOSIS — F4310 Post-traumatic stress disorder, unspecified: Secondary | ICD-10-CM | POA: Diagnosis not present

## 2018-07-23 DIAGNOSIS — F411 Generalized anxiety disorder: Secondary | ICD-10-CM | POA: Diagnosis not present

## 2018-07-23 DIAGNOSIS — F4310 Post-traumatic stress disorder, unspecified: Secondary | ICD-10-CM | POA: Diagnosis not present

## 2018-09-10 DIAGNOSIS — F411 Generalized anxiety disorder: Secondary | ICD-10-CM | POA: Diagnosis not present

## 2018-09-10 DIAGNOSIS — F4310 Post-traumatic stress disorder, unspecified: Secondary | ICD-10-CM | POA: Diagnosis not present

## 2018-09-25 DIAGNOSIS — F411 Generalized anxiety disorder: Secondary | ICD-10-CM | POA: Diagnosis not present

## 2018-09-25 DIAGNOSIS — F4310 Post-traumatic stress disorder, unspecified: Secondary | ICD-10-CM | POA: Diagnosis not present

## 2018-10-09 DIAGNOSIS — F411 Generalized anxiety disorder: Secondary | ICD-10-CM | POA: Diagnosis not present

## 2018-10-09 DIAGNOSIS — F4310 Post-traumatic stress disorder, unspecified: Secondary | ICD-10-CM | POA: Diagnosis not present

## 2018-10-18 DIAGNOSIS — F411 Generalized anxiety disorder: Secondary | ICD-10-CM | POA: Diagnosis not present

## 2018-10-18 DIAGNOSIS — F4310 Post-traumatic stress disorder, unspecified: Secondary | ICD-10-CM | POA: Diagnosis not present

## 2018-10-25 DIAGNOSIS — F411 Generalized anxiety disorder: Secondary | ICD-10-CM | POA: Diagnosis not present

## 2018-10-25 DIAGNOSIS — F4310 Post-traumatic stress disorder, unspecified: Secondary | ICD-10-CM | POA: Diagnosis not present

## 2018-11-20 DIAGNOSIS — F411 Generalized anxiety disorder: Secondary | ICD-10-CM | POA: Diagnosis not present

## 2018-11-20 DIAGNOSIS — F4310 Post-traumatic stress disorder, unspecified: Secondary | ICD-10-CM | POA: Diagnosis not present

## 2018-11-27 DIAGNOSIS — F332 Major depressive disorder, recurrent severe without psychotic features: Secondary | ICD-10-CM | POA: Diagnosis not present

## 2018-11-27 DIAGNOSIS — F4312 Post-traumatic stress disorder, chronic: Secondary | ICD-10-CM | POA: Diagnosis not present

## 2018-11-27 DIAGNOSIS — F5101 Primary insomnia: Secondary | ICD-10-CM | POA: Diagnosis not present

## 2018-11-27 DIAGNOSIS — F411 Generalized anxiety disorder: Secondary | ICD-10-CM | POA: Diagnosis not present

## 2018-11-29 DIAGNOSIS — F411 Generalized anxiety disorder: Secondary | ICD-10-CM | POA: Diagnosis not present

## 2018-11-29 DIAGNOSIS — F4310 Post-traumatic stress disorder, unspecified: Secondary | ICD-10-CM | POA: Diagnosis not present

## 2018-12-04 DIAGNOSIS — F4310 Post-traumatic stress disorder, unspecified: Secondary | ICD-10-CM | POA: Diagnosis not present

## 2018-12-04 DIAGNOSIS — F411 Generalized anxiety disorder: Secondary | ICD-10-CM | POA: Diagnosis not present

## 2018-12-27 DIAGNOSIS — F4310 Post-traumatic stress disorder, unspecified: Secondary | ICD-10-CM | POA: Diagnosis not present

## 2018-12-27 DIAGNOSIS — F411 Generalized anxiety disorder: Secondary | ICD-10-CM | POA: Diagnosis not present

## 2019-01-03 DIAGNOSIS — F411 Generalized anxiety disorder: Secondary | ICD-10-CM | POA: Diagnosis not present

## 2019-01-03 DIAGNOSIS — F4310 Post-traumatic stress disorder, unspecified: Secondary | ICD-10-CM | POA: Diagnosis not present

## 2019-01-04 ENCOUNTER — Emergency Department (HOSPITAL_COMMUNITY)
Admission: EM | Admit: 2019-01-04 | Discharge: 2019-01-04 | Disposition: A | Discharge status: Home / Self Care | Payer: Medicare HMO | Attending: Emergency Medicine | Admitting: Emergency Medicine

## 2019-01-04 ENCOUNTER — Other Ambulatory Visit: Payer: Self-pay

## 2019-01-04 ENCOUNTER — Encounter (HOSPITAL_COMMUNITY): Payer: Self-pay | Admitting: Emergency Medicine

## 2019-01-04 DIAGNOSIS — M25551 Pain in right hip: Secondary | ICD-10-CM | POA: Diagnosis not present

## 2019-01-04 DIAGNOSIS — Y92410 Unspecified street and highway as the place of occurrence of the external cause: Secondary | ICD-10-CM | POA: Insufficient documentation

## 2019-01-04 DIAGNOSIS — M542 Cervicalgia: Secondary | ICD-10-CM | POA: Diagnosis not present

## 2019-01-04 DIAGNOSIS — Y998 Other external cause status: Secondary | ICD-10-CM | POA: Insufficient documentation

## 2019-01-04 DIAGNOSIS — Z5321 Procedure and treatment not carried out due to patient leaving prior to being seen by health care provider: Secondary | ICD-10-CM | POA: Insufficient documentation

## 2019-01-04 DIAGNOSIS — R52 Pain, unspecified: Secondary | ICD-10-CM | POA: Diagnosis not present

## 2019-01-04 DIAGNOSIS — Y939 Activity, unspecified: Secondary | ICD-10-CM | POA: Diagnosis not present

## 2019-01-04 DIAGNOSIS — I10 Essential (primary) hypertension: Secondary | ICD-10-CM | POA: Diagnosis not present
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
## 2019-01-04 NOTE — ED Notes (Signed)
Pt wants collar off and wants to leave AMA. Verbalizes understanding of AMA and taking collar off.

## 2019-01-10 DIAGNOSIS — F4310 Post-traumatic stress disorder, unspecified: Secondary | ICD-10-CM | POA: Diagnosis not present

## 2019-01-10 DIAGNOSIS — F411 Generalized anxiety disorder: Secondary | ICD-10-CM | POA: Diagnosis not present

## 2019-01-17 DIAGNOSIS — F4310 Post-traumatic stress disorder, unspecified: Secondary | ICD-10-CM | POA: Diagnosis not present

## 2019-01-17 DIAGNOSIS — F411 Generalized anxiety disorder: Secondary | ICD-10-CM | POA: Diagnosis not present

## 2019-01-22 ENCOUNTER — Inpatient Hospital Stay: Payer: Medicare HMO | Admitting: Family Medicine

## 2019-01-28 DIAGNOSIS — F411 Generalized anxiety disorder: Secondary | ICD-10-CM | POA: Diagnosis not present

## 2019-01-28 DIAGNOSIS — F4310 Post-traumatic stress disorder, unspecified: Secondary | ICD-10-CM | POA: Diagnosis not present

## 2019-01-29 DIAGNOSIS — F411 Generalized anxiety disorder: Secondary | ICD-10-CM | POA: Diagnosis not present

## 2019-01-29 DIAGNOSIS — F4310 Post-traumatic stress disorder, unspecified: Secondary | ICD-10-CM | POA: Diagnosis not present

## 2019-02-07 DIAGNOSIS — F411 Generalized anxiety disorder: Secondary | ICD-10-CM | POA: Diagnosis not present

## 2019-02-07 DIAGNOSIS — F4310 Post-traumatic stress disorder, unspecified: Secondary | ICD-10-CM | POA: Diagnosis not present

## 2019-02-12 IMAGING — MG DIGITAL SCREENING BILATERAL MAMMOGRAM WITH CAD
5 series · 5 of 5 positions shown · non-contrast
Comparison: Previous exam(s).

CLINICAL DATA: Screening.

EXAM:
DIGITAL SCREENING BILATERAL MAMMOGRAM WITH CAD

[R MLO (1 of 2)]
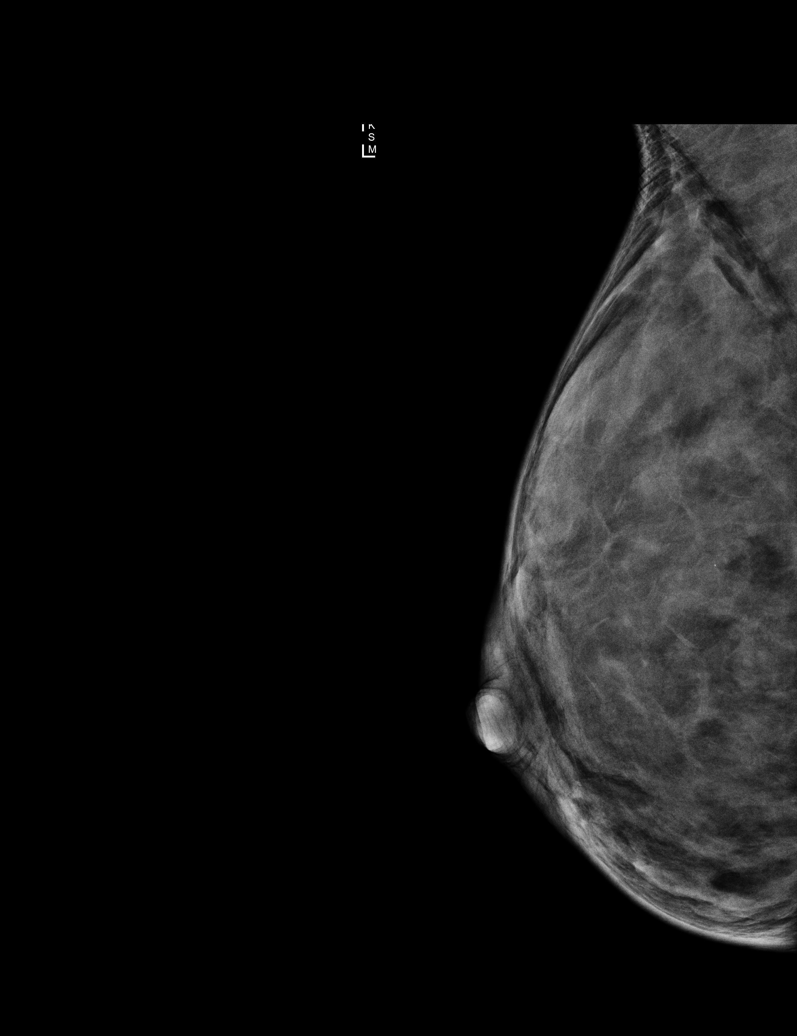

[R MLO (2 of 2)]
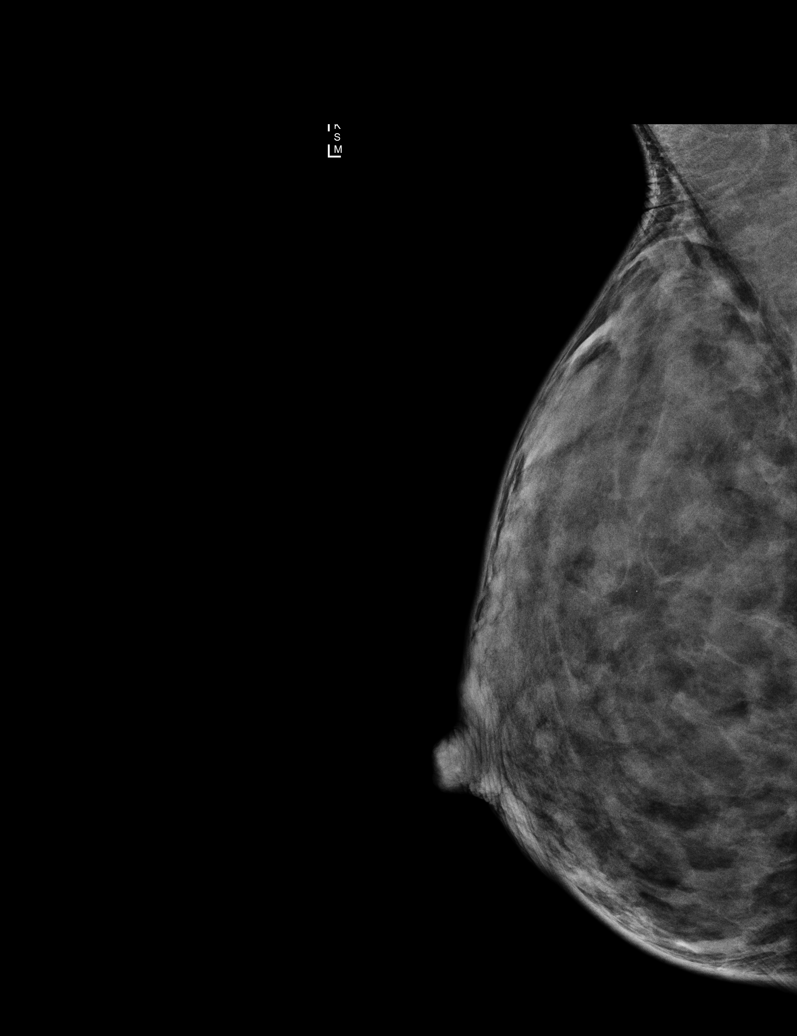

[L CC]
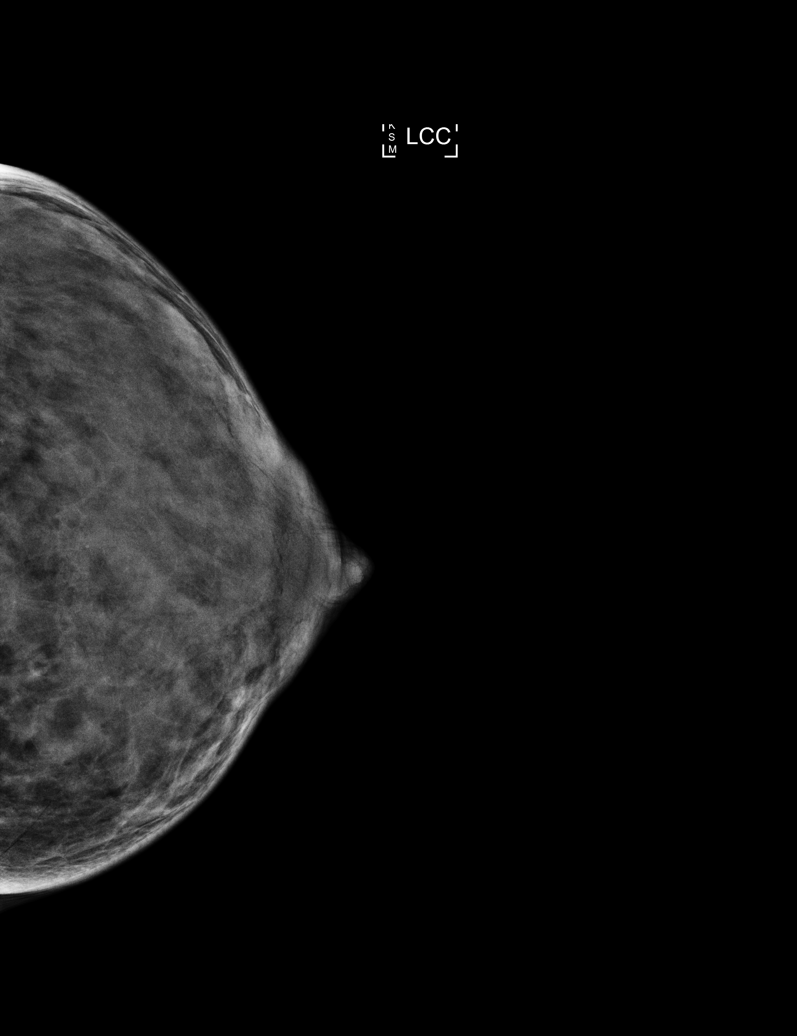

[R CC]
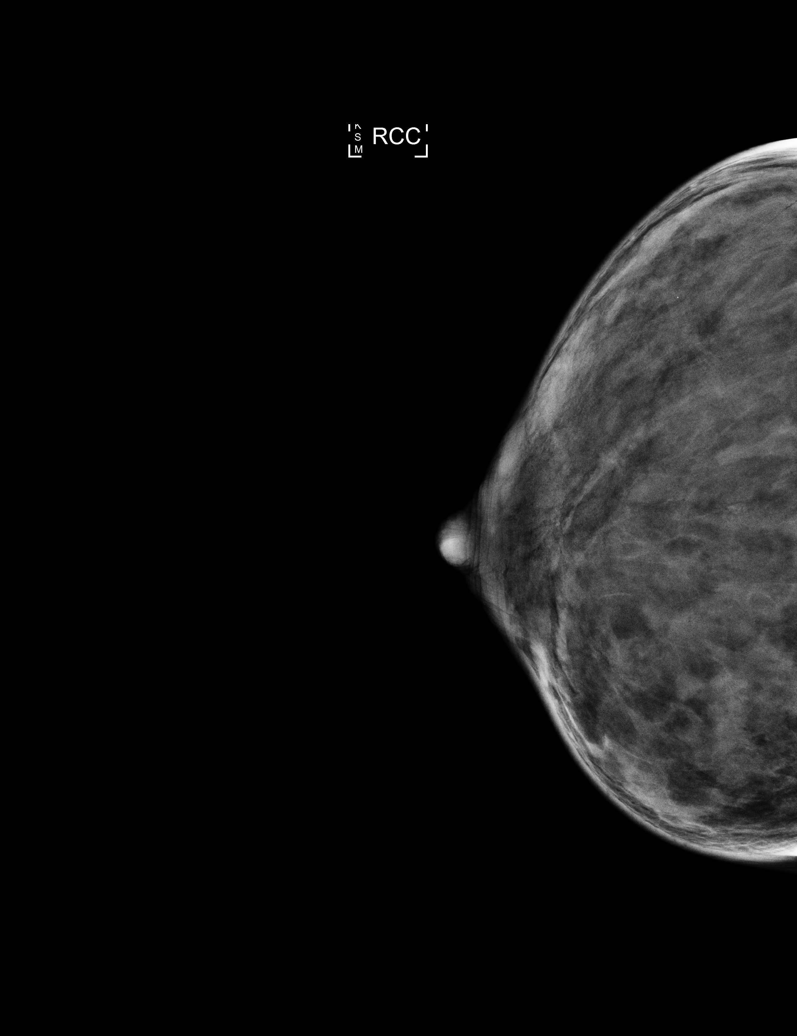

[L MLO]
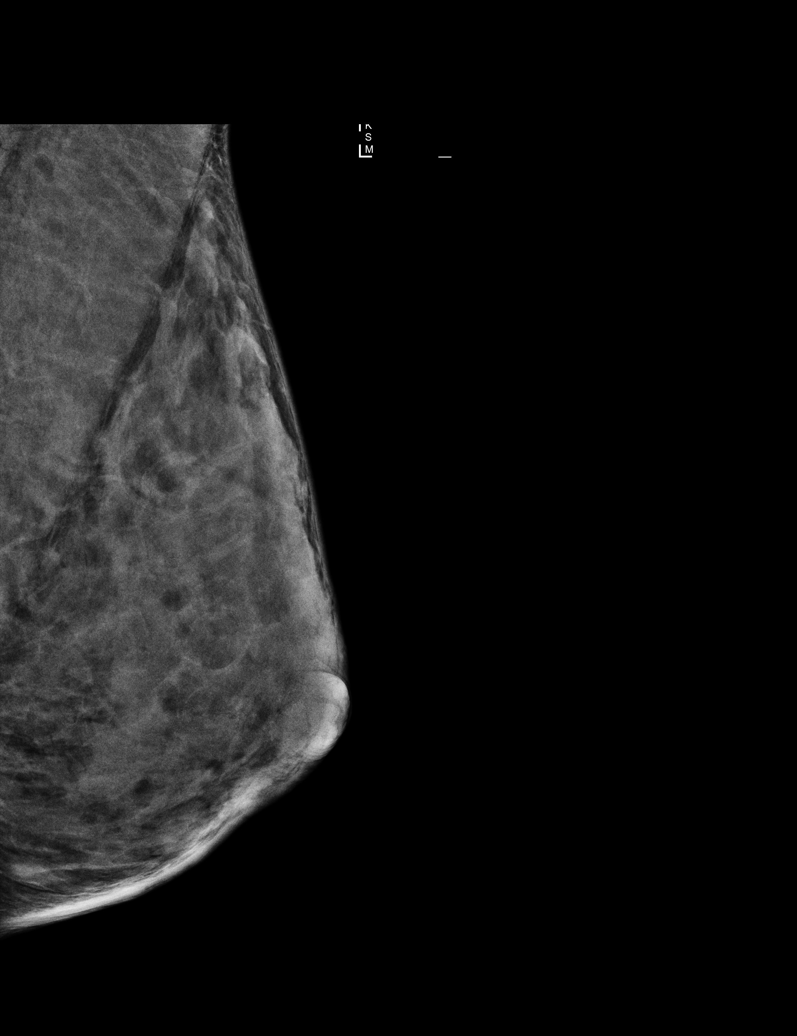

[5 of 5 positions shown; findings below may reference images not displayed]

ACR Breast Density Category d: The breast tissue is extremely dense,
which lowers the sensitivity of mammography.
FINDINGS: There are no findings suspicious for malignancy. Images were
processed with CAD.
IMPRESSION: No mammographic evidence of malignancy. A result letter of this
screening mammogram will be mailed directly to the patient.

RECOMMENDATION:
Screening mammogram in one year. (Code:BD-D-K0F)

BI-RADS CATEGORY  1: Negative.

## 2021-01-02 ENCOUNTER — Other Ambulatory Visit: Payer: Self-pay

## 2021-01-02 ENCOUNTER — Encounter (HOSPITAL_COMMUNITY): Payer: Self-pay | Admitting: Emergency Medicine

## 2021-01-02 ENCOUNTER — Emergency Department (HOSPITAL_COMMUNITY)
Admission: EM | Admit: 2021-01-02 | Discharge: 2021-01-03 | Disposition: A | Discharge status: Home / Self Care | Payer: Medicare HMO | Attending: Emergency Medicine | Admitting: Emergency Medicine

## 2021-01-02 DIAGNOSIS — N611 Abscess of the breast and nipple: Secondary | ICD-10-CM

## 2021-01-02 DIAGNOSIS — N6452 Nipple discharge: Secondary | ICD-10-CM | POA: Diagnosis present

## 2021-01-02 DIAGNOSIS — F17200 Nicotine dependence, unspecified, uncomplicated: Secondary | ICD-10-CM | POA: Diagnosis not present

## 2021-01-02 MED ORDER — DOXYCYCLINE HYCLATE 100 MG PO TABS
100.0000 mg | ORAL_TABLET | Freq: Once | ORAL | Status: AC
  Administered 2021-01-02: 100 mg via ORAL
  Filled 2021-01-02: qty 1

## 2021-01-02 MED ORDER — DOXYCYCLINE HYCLATE 100 MG PO CAPS: 100.0000 mg | ORAL_CAPSULE | Freq: Two times a day (BID) | ORAL | 0 refills | Status: DC

## 2021-01-02 NOTE — ED Triage Notes (Signed)
Pt c/o swelling and pain to right nipple that started Wednesday night and discharge that started today.

## 2021-01-02 NOTE — Discharge Instructions (Signed)
1. Medications: Doxycycline, usual home medications 2. Treatment: rest, drink plenty of fluids, warm compresses to your right breast, do not squeeze or poke the nipple however you will see drainage. 3. Follow Up: Please followup with OB/GYN in the next 1-2 days for reevaluation; Please return to the ER for fevers, worsening symptoms, bleeding that is difficult to control or other concerns.
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
## 2021-01-11 ENCOUNTER — Ambulatory Visit (INDEPENDENT_AMBULATORY_CARE_PROVIDER_SITE_OTHER): Payer: Medicare HMO | Admitting: Family Medicine

## 2021-01-11 ENCOUNTER — Other Ambulatory Visit: Payer: Self-pay

## 2021-01-11 ENCOUNTER — Encounter: Payer: Self-pay | Admitting: Family Medicine

## 2021-01-11 VITALS — BP 158/106 | HR 100 | Temp 99.0°F | Ht 67.5 in | Wt 119.0 lb

## 2021-01-11 DIAGNOSIS — R030 Elevated blood-pressure reading, without diagnosis of hypertension: Secondary | ICD-10-CM | POA: Diagnosis not present

## 2021-01-11 DIAGNOSIS — F439 Reaction to severe stress, unspecified: Secondary | ICD-10-CM

## 2021-01-11 DIAGNOSIS — N611 Abscess of the breast and nipple: Secondary | ICD-10-CM | POA: Insufficient documentation

## 2021-01-11 DIAGNOSIS — F17200 Nicotine dependence, unspecified, uncomplicated: Secondary | ICD-10-CM

## 2021-01-11 DIAGNOSIS — F419 Anxiety disorder, unspecified: Secondary | ICD-10-CM | POA: Diagnosis not present

## 2021-01-11 HISTORY — DX: Abscess of the breast and nipple: N61.1

## 2021-01-11 LAB — POCT URINALYSIS DIP (PROADVANTAGE DEVICE)
Bilirubin, UA: NEGATIVE
Blood, UA: NEGATIVE
Glucose, UA: NEGATIVE mg/dL
Ketones, POC UA: NEGATIVE mg/dL
Leukocytes, UA: NEGATIVE
Nitrite, UA: NEGATIVE
Protein Ur, POC: NEGATIVE mg/dL
Specific Gravity, Urine: 1.01
Urobilinogen, Ur: 0.2
pH, UA: 7 (ref 5.0–8.0)

## 2021-01-11 NOTE — Patient Instructions (Signed)
Your blood pressure is significantly elevated today. Purchase a blood pressure machine for your upper arm and start checking your blood sugar regularly at home.  Several factors are most likely causing your elevated blood pressure including stress, smoking, a high sodium diet, lack of exercise and the fact that you have a family history of hypertension.  We may need to start you on medication as discussed.  Please follow-up in 4 weeks with your blood pressure machine and your blood pressure readings to determine if we need to start you on medication.     Please see the diet approach below to help lower blood pressure.  Follow up with your OB/GYN as scheduled next week or sooner if you are having any worsening symptoms.      [email or web address]">  DASH Eating Plan DASH stands for Dietary Approaches to Stop Hypertension. The DASH eating plan is a healthy eating plan that has been shown to:  Reduce high blood pressure (hypertension).  Reduce your risk for type 2 diabetes, heart disease, and stroke.  Help with weight loss. What are tips for following this plan? Reading food labels  Check food labels for the amount of salt (sodium) per serving. Choose foods with less than 5 percent of the Daily Value of sodium. Generally, foods with less than 300 milligrams (mg) of sodium per serving fit into this eating plan.  To find whole grains, look for the word "whole" as the first word in the ingredient list. Shopping  Buy products labeled as "low-sodium" or "no salt added."  Buy fresh foods. Avoid canned foods and pre-made or frozen meals. Cooking  Avoid adding salt when cooking. Use salt-free seasonings or herbs instead of table salt or sea salt. Check with your health care provider or pharmacist before using salt substitutes.  Do not fry foods. Cook foods using healthy methods such as baking, boiling, grilling, roasting, and broiling  instead.  Cook with heart-healthy oils, such as olive, canola, avocado, soybean, or sunflower oil. Meal planning  Eat a balanced diet that includes: ? 4 or more servings of fruits and 4 or more servings of vegetables each day. Try to fill one-half of your plate with fruits and vegetables. ? 6-8 servings of whole grains each day. ? Less than 6 oz (170 g) of lean meat, poultry, or fish each day. A 3-oz (85-g) serving of meat is about the same size as a deck of cards. One egg equals 1 oz (28 g). ? 2-3 servings of low-fat dairy each day. One serving is 1 cup (237 mL). ? 1 serving of nuts, seeds, or beans 5 times each week. ? 2-3 servings of heart-healthy fats. Healthy fats called omega-3 fatty acids are found in foods such as walnuts, flaxseeds, fortified milks, and eggs. These fats are also found in cold-water fish, such as sardines, salmon, and mackerel.  Limit how much you eat of: ? Canned or prepackaged foods. ? Food that is high in trans fat, such as some fried foods. ? Food that is high in saturated fat, such as fatty meat. ? Desserts and other sweets, sugary drinks, and other foods with added sugar. ? Full-fat dairy products.  Do not salt foods before eating.  Do not eat more than 4 egg yolks a week.  Try to eat at least 2 vegetarian meals a week.  Eat more home-cooked food and less restaurant, buffet, and fast food.   Lifestyle  When eating at a restaurant, ask that your food be prepared with less  salt or no salt, if possible.  If you drink alcohol: ? Limit how much you use to:  0-1 drink a day for women who are not pregnant.  0-2 drinks a day for men. ? Be aware of how much alcohol is in your drink. In the [place] one drink equals one 12 oz bottle of beer (355 mL), one 5 oz glass of wine (148 mL), or one 1 oz glass of hard liquor (44 mL). General information  Avoid eating more than 2,300 mg of salt a day. If you have hypertension, you may need to reduce your sodium intake  to 1,500 mg a day.  Work with your health care provider to maintain a healthy body weight or to lose weight. Ask what an ideal weight is for you.  Get at least 30 minutes of exercise that causes your heart to beat faster (aerobic exercise) most days of the week. Activities may include walking, swimming, or biking.  Work with your health care provider or dietitian to adjust your eating plan to your individual calorie needs. What foods should I eat? Fruits All fresh, dried, or frozen fruit. Canned fruit in natural juice (without added sugar). Vegetables Fresh or frozen vegetables (raw, steamed, roasted, or grilled). Low-sodium or reduced-sodium tomato and vegetable juice. Low-sodium or reduced-sodium tomato sauce and tomato paste. Low-sodium or reduced-sodium canned vegetables. Grains Whole-grain or whole-wheat bread. Whole-grain or whole-wheat pasta. Brown rice. [place]. Bulgur. Whole-grain and low-sodium cereals. Pita bread. Low-fat, low-sodium crackers. Whole-wheat flour tortillas. Meats and other proteins Skinless chicken or [place]. Ground chicken or [place]. Pork with fat trimmed off. Fish and seafood. Egg whites. Dried beans, peas, or lentils. Unsalted nuts, nut butters, and seeds. Unsalted canned beans. Lean cuts of beef with fat trimmed off. Low-sodium, lean precooked or cured meat, such as sausages or meat loaves. Dairy Low-fat (1%) or fat-free (skim) milk. Reduced-fat, low-fat, or fat-free cheeses. Nonfat, low-sodium ricotta or cottage cheese. Low-fat or nonfat yogurt. Low-fat, low-sodium cheese. Fats and oils Soft margarine without trans fats. Vegetable oil. Reduced-fat, low-fat, or light mayonnaise and salad dressings (reduced-sodium). Canola, safflower, olive, avocado, soybean, and sunflower oils. Avocado. Seasonings and condiments Herbs. Spices. Seasoning mixes without salt. Other foods Unsalted popcorn and pretzels. Fat-free sweets. The items listed above may not be a  complete list of foods and beverages you can eat. Contact a dietitian for more information. What foods should I avoid? Fruits Canned fruit in a light or heavy syrup. Fried fruit. Fruit in cream or butter sauce. Vegetables Creamed or fried vegetables. Vegetables in a cheese sauce. Regular canned vegetables (not low-sodium or reduced-sodium). Regular canned tomato sauce and paste (not low-sodium or reduced-sodium). Regular tomato and vegetable juice (not low-sodium or reduced-sodium). Angie Fava. Olives. Grains Baked goods made with fat, such as croissants, muffins, or some breads. Dry pasta or rice meal packs. Meats and other proteins Fatty cuts of meat. Ribs. Fried meat. Berniece Salines. Bologna, salami, and other precooked or cured meats, such as sausages or meat loaves. Fat from the back of a pig (fatback). Bratwurst. Salted nuts and seeds. Canned beans with added salt. Canned or smoked fish. Whole eggs or egg yolks. Chicken or [place] with skin. Dairy Whole or 2% milk, cream, and half-and-half. Whole or full-fat cream cheese. Whole-fat or sweetened yogurt. Full-fat cheese. Nondairy creamers. Whipped toppings. Processed cheese and cheese spreads. Fats and oils Butter. Stick margarine. Lard. Shortening. Ghee. Bacon fat. Tropical oils, such as coconut, palm kernel, or palm oil. Seasonings and condiments Onion salt,  garlic salt, seasoned salt, table salt, and sea salt. Worcestershire sauce. Tartar sauce. Barbecue sauce. Teriyaki sauce. Soy sauce, including reduced-sodium. Steak sauce. Canned and packaged gravies. Fish sauce. Oyster sauce. Cocktail sauce. Store-bought horseradish. Ketchup. Mustard. Meat flavorings and tenderizers. Bouillon cubes. Hot sauces. Pre-made or packaged marinades. Pre-made or packaged taco seasonings. Relishes. Regular salad dressings. Other foods Salted popcorn and pretzels. The items listed above may not be a complete list of foods and beverages you should avoid. Contact a dietitian for  more information. Where to find more information  National Heart, Lung, and Blood Institute: https://wilson-eaton.com/  American Heart Association: www.heart.org  Academy of Nutrition and Dietetics: www.eatright.[place]: www.kidney.org Summary  The DASH eating plan is a healthy eating plan that has been shown to reduce high blood pressure (hypertension). It may also reduce your risk for type 2 diabetes, heart disease, and stroke.  When on the DASH eating plan, aim to eat more fresh fruits and vegetables, whole grains, lean proteins, low-fat dairy, and heart-healthy fats.  With the DASH eating plan, you should limit salt (sodium) intake to 2,300 mg a day. If you have hypertension, you may need to reduce your sodium intake to 1,500 mg a day.  Work with your health care provider or dietitian to adjust your eating plan to your individual calorie needs. This information is not intended to replace advice given to you by your health care provider. Make sure you discuss any questions you have with your health care provider. Document Revised: 09/26/2019 Document Reviewed: 09/26/2019 Elsevier Patient Education  2021 [employer].

## 2021-01-11 NOTE — Progress Notes (Signed)
   Subjective:    Patient ID: [name], female    DOB: [birth year], 50 y.o.   MRN: [id number]  HPI Chief Complaint  Patient presents with  . Hospitalization Follow-up   I have not seen her since 2019.  She is here today for a follow-up on recent visit to the ED.  Diagnosed with abscess of her right nipple.  She was started on doxycycline. States right breast with nipple discharge and redness, swelling.  Reports symptoms are much better now.  She has an appointment with her OB/GYN next week. Denies any pain or nipple discharge.  States her blood pressure was elevated at the ED.  Denies history of hypertension.  Prefers to not start on medication unless absolutely necessary.  Attributes elevated blood pressure to recent increase in stress and lack of sleep.  Reports having several family members passed away recently. States she has an issue with anxiety which increases her blood pressure.  States she recently increased her smoking and is now smoking approximately 1 pack/day.  She does not check her blood pressure at home.  States her diet is fairly high in sodium as she has been eating fast food often. States she no longer swims which she used to do for exercise.   Denies fever, chills, dizziness, headache, vision changes, chest pain, palpitations, shortness of breath, abdominal pain, N/V/D, urinary symptoms, LE edema.     Review of Systems Pertinent positives and negatives in the history of present illness.     Objective:   Physical Exam BP (!) 158/106   Pulse 100   Temp 99 F (37.2 C)   Ht 5' 7.5" (1.715 m)   Wt 119 lb (54 kg)   LMP 12/03/2017   SpO2 98%   BMI 18.36 kg/m   Alert and in no distress. Cardiac exam shows a regular sinus rhythm without murmurs or gallops. Lungs are clear to auscultation.  Extremities without edema.  Skin is warm and dry.       Assessment & Plan:  Elevated blood pressure reading - Plan: CBC with Differential/Platelet, Comprehensive  metabolic panel, POCT Urinalysis DIP (Proadvantage Device)  Abscess of nipple - Plan: CBC with Differential/Platelet  Anxiety  Smoker  Stress  Discussed that her blood pressure was significantly elevated today.  Prefers to hold off on medication and only take it if absolutely necessary.  States she feels fine. Discussed that her elevated blood pressure is most likely related to several factors including stress, anxiety, smoking, diet high in sodium, lack of exercise and family history of hypertension.   I recommend purchasing a blood pressure cuff and start checking her blood pressure at home.  I also recommend smoking cessation, improving her diet by cutting back on sodium and improving stress and anxiety. Discussed guidelines for diagnosis of hypertension.  She will return in 4 weeks with her blood pressure machine and her readings.  She is aware that we may need to start her on medication at that time.  She prefers to not start a medication today. DASH diet handout provided. Reports nipple abscess has improved significantly and she will complete the antibiotic.  Follow-up with OB/GYN as scheduled next week.

## 2021-01-12 LAB — CBC WITH DIFFERENTIAL/PLATELET
Basophils Absolute: 0.1 10*3/uL (ref 0.0–0.2)
Basos: 1 %
EOS (ABSOLUTE): 0 10*3/uL (ref 0.0–0.4)
Eos: 1 %
Hematocrit: 46 % (ref 34.0–46.6)
Hemoglobin: 15.2 g/dL (ref 11.1–15.9)
Immature Grans (Abs): 0 10*3/uL (ref 0.0–0.1)
Immature Granulocytes: 0 %
Lymphocytes Absolute: 1.8 10*3/uL (ref 0.7–3.1)
Lymphs: 34 %
MCH: 32.2 pg (ref 26.6–33.0)
MCHC: 33 g/dL (ref 31.5–35.7)
MCV: 98 fL — ABNORMAL HIGH (ref 79–97)
Monocytes Absolute: 0.5 10*3/uL (ref 0.1–0.9)
Monocytes: 10 %
Neutrophils Absolute: 2.9 10*3/uL (ref 1.4–7.0)
Neutrophils: 54 %
Platelets: 282 10*3/uL (ref 150–450)
RBC: 4.72 x10E6/uL (ref 3.77–5.28)
RDW: 11.6 % — ABNORMAL LOW (ref 11.7–15.4)
WBC: 5.4 10*3/uL (ref 3.4–10.8)

## 2021-01-12 LAB — COMPREHENSIVE METABOLIC PANEL
ALT: 12 IU/L (ref 0–32)
AST: 20 IU/L (ref 0–40)
Albumin/Globulin Ratio: 1.7 (ref 1.2–2.2)
Albumin: 4.7 g/dL (ref 3.8–4.8)
Alkaline Phosphatase: 90 IU/L (ref 44–121)
BUN/Creatinine Ratio: 10 (ref 9–23)
BUN: 6 mg/dL (ref 6–24)
Bilirubin Total: 0.5 mg/dL (ref 0.0–1.2)
CO2: 18 mmol/L — ABNORMAL LOW (ref 20–29)
Calcium: 9.8 mg/dL (ref 8.7–10.2)
Chloride: 103 mmol/L (ref 96–106)
Creatinine, Ser: 0.59 mg/dL (ref 0.57–1.00)
Globulin, Total: 2.7 g/dL (ref 1.5–4.5)
Glucose: 91 mg/dL (ref 65–99)
Potassium: 4.5 mmol/L (ref 3.5–5.2)
Sodium: 137 mmol/L (ref 134–144)
Total Protein: 7.4 g/dL (ref 6.0–8.5)
eGFR: 110 mL/min/{1.73_m2} (ref >59)

## 2021-01-18 ENCOUNTER — Ambulatory Visit (INDEPENDENT_AMBULATORY_CARE_PROVIDER_SITE_OTHER): Payer: Medicare HMO | Admitting: Family Medicine

## 2021-01-18 ENCOUNTER — Encounter: Payer: Self-pay | Admitting: Family Medicine

## 2021-01-18 ENCOUNTER — Other Ambulatory Visit: Payer: Self-pay

## 2021-01-18 VITALS — BP 143/107 | HR 88 | Ht 67.5 in | Wt 120.0 lb

## 2021-01-18 DIAGNOSIS — N611 Abscess of the breast and nipple: Secondary | ICD-10-CM | POA: Diagnosis not present

## 2021-01-18 DIAGNOSIS — Z1231 Encounter for screening mammogram for malignant neoplasm of breast: Secondary | ICD-10-CM

## 2021-01-18 NOTE — Assessment & Plan Note (Signed)
Symptoms resolved and unremarkable exam. No skin changes such as peu d'orange or masses palpated to suggest underlying malignancy as the cause but regardless is overdue for mammogram. Ordered for diagnostic but reassured patient that etiology is most likely benign. Will follow up with her after mammo.
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
## 2021-02-09 ENCOUNTER — Encounter: Payer: Self-pay | Admitting: Family Medicine

## 2021-02-09 ENCOUNTER — Ambulatory Visit (INDEPENDENT_AMBULATORY_CARE_PROVIDER_SITE_OTHER): Payer: Medicare HMO | Admitting: Family Medicine

## 2021-02-09 ENCOUNTER — Other Ambulatory Visit: Payer: Self-pay

## 2021-02-09 VITALS — BP 160/100 | HR 94 | Wt 118.2 lb

## 2021-02-09 DIAGNOSIS — F17200 Nicotine dependence, unspecified, uncomplicated: Secondary | ICD-10-CM

## 2021-02-09 DIAGNOSIS — I10 Essential (primary) hypertension: Secondary | ICD-10-CM

## 2021-02-09 MED ORDER — LOSARTAN POTASSIUM-HCTZ 50-12.5 MG PO TABS: 1.0000 | ORAL_TABLET | Freq: Every day | ORAL | 1 refills | Status: DC

## 2021-02-09 NOTE — Progress Notes (Signed)
   Subjective:    Patient ID: [name], female    DOB: [birth year], 50 y.o.   MRN: [id number]  HPI Chief Complaint  Patient presents with  . Blood pressure f/u   She is here for a 4 week follow up on elevated BP at her first visit with me. No history of HTN at that time per patient.   States she has cut back on sodium. BP at home has been elevated. She had an elevated reading at her OB/GYN appt as well.   Denies fever, chills, dizziness, chest pain, palpitations, shortness of breath, abdominal pain, N/V/D, urinary symptoms, LE edema.   Reviewed allergies, medications, past medical, surgical, family, and social history.    Review of Systems Pertinent positives and negatives in the history of present illness.     Objective:   Physical Exam BP (!) 160/100   Pulse 94   Wt 118 lb 3.2 oz (53.6 kg)   LMP 12/03/2017   SpO2 99%   BMI 18.24 kg/m   Alert and oriented and in no acute distress. Respirations unlabored.       Assessment & Plan:  Uncontrolled hypertension - Plan: EKG 12-Lead, losartan-hydrochlorothiazide (HYZAAR) 50-12.5 MG tablet  Smoker  EKG done for baseline purposes- NSR with a rate of 84. Read by Dr. Redmond School Reviewed labs and urine results from previous visit which were fine.  Discussed potential long term health consequences associated with uncontrolled HTN.   Started her on medication and she is amenable. Continue low sodium diet.  Recommend smoking cessation. She is working on it.  Follow up in 4-6 weeks or sooner if needed. Bring in BP machine and readings.

## 2021-02-09 NOTE — Patient Instructions (Addendum)
Start on the medication daily. Continue eating a low sodium diet.  Check your BP at home daily for the next 4 weeks.   Follow up in 4 weeks or sooner if needed.        [email or web address]">  DASH Eating Plan DASH stands for Dietary Approaches to Stop Hypertension. The DASH eating plan is a healthy eating plan that has been shown to:  Reduce high blood pressure (hypertension).  Reduce your risk for type 2 diabetes, heart disease, and stroke.  Help with weight loss. What are tips for following this plan? Reading food labels  Check food labels for the amount of salt (sodium) per serving. Choose foods with less than 5 percent of the Daily Value of sodium. Generally, foods with less than 300 milligrams (mg) of sodium per serving fit into this eating plan.  To find whole grains, look for the word "whole" as the first word in the ingredient list. Shopping  Buy products labeled as "low-sodium" or "no salt added."  Buy fresh foods. Avoid canned foods and pre-made or frozen meals. Cooking  Avoid adding salt when cooking. Use salt-free seasonings or herbs instead of table salt or sea salt. Check with your health care provider or pharmacist before using salt substitutes.  Do not fry foods. Cook foods using healthy methods such as baking, boiling, grilling, roasting, and broiling instead.  Cook with heart-healthy oils, such as olive, canola, avocado, soybean, or sunflower oil. Meal planning  Eat a balanced diet that includes: ? 4 or more servings of fruits and 4 or more servings of vegetables each day. Try to fill one-half of your plate with fruits and vegetables. ? 6-8 servings of whole grains each day. ? Less than 6 oz (170 g) of lean meat, poultry, or fish each day. A 3-oz (85-g) serving of meat is about the same size as a deck of cards. One egg equals 1 oz (28 g). ? 2-3 servings of low-fat dairy each day. One serving is 1 cup (237  mL). ? 1 serving of nuts, seeds, or beans 5 times each week. ? 2-3 servings of heart-healthy fats. Healthy fats called omega-3 fatty acids are found in foods such as walnuts, flaxseeds, fortified milks, and eggs. These fats are also found in cold-water fish, such as sardines, salmon, and mackerel.  Limit how much you eat of: ? Canned or prepackaged foods. ? Food that is high in trans fat, such as some fried foods. ? Food that is high in saturated fat, such as fatty meat. ? Desserts and other sweets, sugary drinks, and other foods with added sugar. ? Full-fat dairy products.  Do not salt foods before eating.  Do not eat more than 4 egg yolks a week.  Try to eat at least 2 vegetarian meals a week.  Eat more home-cooked food and less restaurant, buffet, and fast food.   Lifestyle  When eating at a restaurant, ask that your food be prepared with less salt or no salt, if possible.  If you drink alcohol: ? Limit how much you use to:  0-1 drink a day for women who are not pregnant.  0-2 drinks a day for men. ? Be aware of how much alcohol is in your drink. In the [place] one drink equals one 12 oz bottle of beer (355 mL), one 5 oz glass of wine (148 mL), or one 1 oz glass of hard liquor (44 mL). General information  Avoid eating more than 2,300 mg of salt a day.  If you have hypertension, you may need to reduce your sodium intake to 1,500 mg a day.  Work with your health care provider to maintain a healthy body weight or to lose weight. Ask what an ideal weight is for you.  Get at least 30 minutes of exercise that causes your heart to beat faster (aerobic exercise) most days of the week. Activities may include walking, swimming, or biking.  Work with your health care provider or dietitian to adjust your eating plan to your individual calorie needs. What foods should I eat? Fruits All fresh, dried, or frozen fruit. Canned fruit in natural juice (without added sugar). Vegetables Fresh  or frozen vegetables (raw, steamed, roasted, or grilled). Low-sodium or reduced-sodium tomato and vegetable juice. Low-sodium or reduced-sodium tomato sauce and tomato paste. Low-sodium or reduced-sodium canned vegetables. Grains Whole-grain or whole-wheat bread. Whole-grain or whole-wheat pasta. Brown rice. [place]. Bulgur. Whole-grain and low-sodium cereals. Pita bread. Low-fat, low-sodium crackers. Whole-wheat flour tortillas. Meats and other proteins Skinless chicken or [place]. Ground chicken or [place]. Pork with fat trimmed off. Fish and seafood. Egg whites. Dried beans, peas, or lentils. Unsalted nuts, nut butters, and seeds. Unsalted canned beans. Lean cuts of beef with fat trimmed off. Low-sodium, lean precooked or cured meat, such as sausages or meat loaves. Dairy Low-fat (1%) or fat-free (skim) milk. Reduced-fat, low-fat, or fat-free cheeses. Nonfat, low-sodium ricotta or cottage cheese. Low-fat or nonfat yogurt. Low-fat, low-sodium cheese. Fats and oils Soft margarine without trans fats. Vegetable oil. Reduced-fat, low-fat, or light mayonnaise and salad dressings (reduced-sodium). Canola, safflower, olive, avocado, soybean, and sunflower oils. Avocado. Seasonings and condiments Herbs. Spices. Seasoning mixes without salt. Other foods Unsalted popcorn and pretzels. Fat-free sweets. The items listed above may not be a complete list of foods and beverages you can eat. Contact a dietitian for more information. What foods should I avoid? Fruits Canned fruit in a light or heavy syrup. Fried fruit. Fruit in cream or butter sauce. Vegetables Creamed or fried vegetables. Vegetables in a cheese sauce. Regular canned vegetables (not low-sodium or reduced-sodium). Regular canned tomato sauce and paste (not low-sodium or reduced-sodium). Regular tomato and vegetable juice (not low-sodium or reduced-sodium). Angie Fava. Olives. Grains Baked goods made with fat, such as croissants, muffins, or  some breads. Dry pasta or rice meal packs. Meats and other proteins Fatty cuts of meat. Ribs. Fried meat. Berniece Salines. Bologna, salami, and other precooked or cured meats, such as sausages or meat loaves. Fat from the back of a pig (fatback). Bratwurst. Salted nuts and seeds. Canned beans with added salt. Canned or smoked fish. Whole eggs or egg yolks. Chicken or [place] with skin. Dairy Whole or 2% milk, cream, and half-and-half. Whole or full-fat cream cheese. Whole-fat or sweetened yogurt. Full-fat cheese. Nondairy creamers. Whipped toppings. Processed cheese and cheese spreads. Fats and oils Butter. Stick margarine. Lard. Shortening. Ghee. Bacon fat. Tropical oils, such as coconut, palm kernel, or palm oil. Seasonings and condiments Onion salt, garlic salt, seasoned salt, table salt, and sea salt. Worcestershire sauce. Tartar sauce. Barbecue sauce. Teriyaki sauce. Soy sauce, including reduced-sodium. Steak sauce. Canned and packaged gravies. Fish sauce. Oyster sauce. Cocktail sauce. Store-bought horseradish. Ketchup. Mustard. Meat flavorings and tenderizers. Bouillon cubes. Hot sauces. Pre-made or packaged marinades. Pre-made or packaged taco seasonings. Relishes. Regular salad dressings. Other foods Salted popcorn and pretzels. The items listed above may not be a complete list of foods and beverages you should avoid. Contact a dietitian for more information. Where to find more information  National Heart, Lung, and Blood Institute: https://wilson-eaton.com/  American Heart Association: www.heart.org  Academy of Nutrition and Dietetics: www.eatright.[place]: www.kidney.org Summary  The DASH eating plan is a healthy eating plan that has been shown to reduce high blood pressure (hypertension). It may also reduce your risk for type 2 diabetes, heart disease, and stroke.  When on the DASH eating plan, aim to eat more fresh fruits and vegetables, whole grains, lean proteins, low-fat  dairy, and heart-healthy fats.  With the DASH eating plan, you should limit salt (sodium) intake to 2,300 mg a day. If you have hypertension, you may need to reduce your sodium intake to 1,500 mg a day.  Work with your health care provider or dietitian to adjust your eating plan to your individual calorie needs. This information is not intended to replace advice given to you by your health care provider. Make sure you discuss any questions you have with your health care provider. Document Revised: 09/26/2019 Document Reviewed: 09/26/2019 Elsevier Patient Education  2021 [employer].

## 2021-02-10 ENCOUNTER — Encounter: Payer: Self-pay | Admitting: Internal Medicine

## 2021-02-10 ENCOUNTER — Encounter: Payer: Self-pay | Admitting: Medical

## 2021-03-14 ENCOUNTER — Other Ambulatory Visit: Payer: Self-pay

## 2021-03-14 ENCOUNTER — Ambulatory Visit
Admission: RE | Admit: 2021-03-14 | Discharge: 2021-03-14 | Disposition: A | Discharge status: Home / Self Care | Payer: Medicare HMO | Source: Ambulatory Visit | Attending: Family Medicine | Admitting: Family Medicine

## 2021-03-14 DIAGNOSIS — Z1231 Encounter for screening mammogram for malignant neoplasm of breast: Secondary | ICD-10-CM

## 2021-03-16 ENCOUNTER — Ambulatory Visit (INDEPENDENT_AMBULATORY_CARE_PROVIDER_SITE_OTHER): Payer: Medicare HMO | Admitting: Family Medicine

## 2021-03-16 ENCOUNTER — Encounter: Payer: Self-pay | Admitting: Family Medicine

## 2021-03-16 VITALS — BP 120/80 | HR 69 | Wt 120.4 lb

## 2021-03-16 DIAGNOSIS — I10 Essential (primary) hypertension: Secondary | ICD-10-CM | POA: Diagnosis not present

## 2021-03-16 NOTE — Progress Notes (Signed)
   Subjective:    Patient ID: [name], female    DOB: 1970-12-22, 50 y.o.   MRN: [id number]  HPI Chief Complaint  Patient presents with  . follow-up    Follow-up on HTN. Running normal range    She is here for 4-week hypertension follow-up.  We started her on losartan/HCTZ and she is taking it daily without any side effects.  She reports making healthy changes in her diet and cutting out a lot of sodium especially pork.  States she feels better.  Reports checking her blood pressure at home and has been seeing the readings improve.  No new concerns today.   Review of Systems Pertinent positives and negatives in the history of present illness.     Objective:   Physical Exam BP 120/80   Pulse 69   Wt 120 lb 6.4 oz (54.6 kg)   LMP 12/03/2017   BMI 18.58 kg/m   Alert and oriented and in no acute distress.  Respirations unlabored.  Good mood.      Assessment & Plan:  Primary hypertension  Blood pressure has responded quite well to losartan HCTZ.  She will continue medication and she is not having side effects.  Continue with her current healthy, low-sodium diet.  She has started working out.  She is keeping an eye on her blood pressure and will continue to do so.  Follow-up as needed.

## 2021-04-11 ENCOUNTER — Other Ambulatory Visit: Payer: Self-pay | Admitting: Family Medicine

## 2021-04-11 DIAGNOSIS — I10 Essential (primary) hypertension: Secondary | ICD-10-CM

## 2021-10-10 ENCOUNTER — Other Ambulatory Visit: Payer: Self-pay | Admitting: Family Medicine

## 2021-10-10 ENCOUNTER — Other Ambulatory Visit: Payer: Self-pay

## 2021-10-10 DIAGNOSIS — I10 Essential (primary) hypertension: Secondary | ICD-10-CM

## 2021-10-10 MED ORDER — LOSARTAN POTASSIUM-HCTZ 50-12.5 MG PO TABS: 1.0000 | ORAL_TABLET | Freq: Every day | ORAL | 0 refills | Status: DC

## 2021-11-12 ENCOUNTER — Other Ambulatory Visit: Payer: Self-pay | Admitting: Family Medicine

## 2021-11-12 DIAGNOSIS — I10 Essential (primary) hypertension: Secondary | ICD-10-CM

## 2021-11-16 ENCOUNTER — Telehealth: Payer: Self-pay | Admitting: Physician Assistant

## 2021-11-16 NOTE — Telephone Encounter (Signed)
Left message for patient to call back and schedule Medicare Annual Wellness Visit (AWV) either virtually or in office. I left my number for patient to call [phone].  Last AWV 05/15/18  please schedule at anytime with health coach  This should be a 45 minute visit.

## 2021-12-23 ENCOUNTER — Ambulatory Visit (INDEPENDENT_AMBULATORY_CARE_PROVIDER_SITE_OTHER): Payer: Medicare HMO

## 2021-12-23 VITALS — Ht 68.0 in | Wt 128.0 lb

## 2021-12-23 DIAGNOSIS — Z1211 Encounter for screening for malignant neoplasm of colon: Secondary | ICD-10-CM

## 2021-12-23 DIAGNOSIS — Z0000 Encounter for general adult medical examination without abnormal findings: Secondary | ICD-10-CM
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
## 2021-12-23 NOTE — Patient Instructions (Signed)
[name] , Thank you for taking time to come for your Medicare Wellness Visit. I appreciate your ongoing commitment to your health goals. Please review the following plan we discussed and let me know if I can assist you in the future.   Screening recommendations/referrals: Colonoscopy: cologuard ordered today Mammogram: completed 03/14/2021, due 03/15/2022 Bone Density: n/a Recommended yearly ophthalmology/optometry visit for glaucoma screening and checkup Recommended yearly dental visit for hygiene and checkup  Vaccinations: Influenza vaccine: decline Pneumococcal vaccine: n/a Tdap vaccine: decline Shingles vaccine: decline  Covid-19:  decline  Advanced directives: copy in chart  Conditions/risks identified: smoking  Next appointment: Follow up in one year for your annual wellness visit.   Preventive Care 40-64 Years, Female Preventive care refers to lifestyle choices and visits with your health care provider that can promote health and wellness. What does preventive care include? A yearly physical exam. This is also called an annual well check. Dental exams once or twice a year. Routine eye exams. Ask your health care provider how often you should have your eyes checked. Personal lifestyle choices, including: Daily care of your teeth and gums. Regular physical activity. Eating a healthy diet. Avoiding tobacco and drug use. Limiting alcohol use. Practicing safe sex. Taking low-dose aspirin daily starting at age 32. Taking vitamin and mineral supplements as recommended by your health care provider. What happens during an annual well check? The services and screenings done by your health care provider during your annual well check will depend on your age, overall health, lifestyle risk factors, and family history of disease. Counseling  Your health care provider may ask you questions about your: Alcohol use. Tobacco use. Drug use. Emotional well-being. Home and  relationship well-being. Sexual activity. Eating habits. Work and work Statistician. Method of birth control. Menstrual cycle. Pregnancy history. Screening  You may have the following tests or measurements: Height, weight, and BMI. Blood pressure. Lipid and cholesterol levels. These may be checked every 5 years, or more frequently if you are over 47 years old. Skin check. Lung cancer screening. You may have this screening every year starting at age 70 if you have a 30-pack-year history of smoking and currently smoke or have quit within the past 15 years. Fecal occult blood test (FOBT) of the stool. You may have this test every year starting at age 46. Flexible sigmoidoscopy or colonoscopy. You may have a sigmoidoscopy every 5 years or a colonoscopy every 10 years starting at age 30. Hepatitis C blood test. Hepatitis B blood test. Sexually transmitted disease (STD) testing. Diabetes screening. This is done by checking your blood sugar (glucose) after you have not eaten for a while (fasting). You may have this done every 1-3 years. Mammogram. This may be done every 1-2 years. Talk to your health care provider about when you should start having regular mammograms. This may depend on whether you have a family history of breast cancer. BRCA-related cancer screening. This may be done if you have a family history of breast, ovarian, tubal, or peritoneal cancers. Pelvic exam and Pap test. This may be done every 3 years starting at age 77. Starting at age 58, this may be done every 5 years if you have a Pap test in combination with an HPV test. Bone density scan. This is done to screen for osteoporosis. You may have this scan if you are at high risk for osteoporosis. Discuss your test results, treatment options, and if necessary, the need for more tests with your health care provider.  Vaccines  Your health care provider may recommend certain vaccines, such as: Influenza vaccine. This is recommended  every year. Tetanus, diphtheria, and acellular pertussis (Tdap, Td) vaccine. You may need a Td booster every 10 years. Zoster vaccine. You may need this after age 36. Pneumococcal 13-valent conjugate (PCV13) vaccine. You may need this if you have certain conditions and were not previously vaccinated. Pneumococcal polysaccharide (PPSV23) vaccine. You may need one or two doses if you smoke cigarettes or if you have certain conditions. Talk to your health care provider about which screenings and vaccines you need and how often you need them. This information is not intended to replace advice given to you by your health care provider. Make sure you discuss any questions you have with your health care provider. Document Released: 11/19/2015 Document Revised: 07/12/2016 Document Reviewed: 08/24/2015 Elsevier Interactive Patient Education  2017 [place] Prevention in the Home Falls can cause injuries. They can happen to people of all ages. There are many things you can do to make your home safe and to help prevent falls. What can I do on the outside of my home? Regularly fix the edges of walkways and driveways and fix any cracks. Remove anything that might make you trip as you walk through a door, such as a raised step or threshold. Trim any bushes or trees on the path to your home. Use bright outdoor lighting. Clear any walking paths of anything that might make someone trip, such as rocks or tools. Regularly check to see if handrails are loose or broken. Make sure that both sides of any steps have handrails. Any raised decks and porches should have guardrails on the edges. Have any leaves, snow, or ice cleared regularly. Use sand or salt on walking paths during winter. Clean up any spills in your garage right away. This includes oil or grease spills. What can I do in the bathroom? Use night lights. Install grab bars by the toilet and in the tub and shower. Do not use towel bars as  grab bars. Use non-skid mats or decals in the tub or shower. If you need to sit down in the shower, use a plastic, non-slip stool. Keep the floor dry. Clean up any water that spills on the floor as soon as it happens. Remove soap buildup in the tub or shower regularly. Attach bath mats securely with double-sided non-slip rug tape. Do not have throw rugs and other things on the floor that can make you trip. What can I do in the bedroom? Use night lights. Make sure that you have a light by your bed that is easy to reach. Do not use any sheets or blankets that are too big for your bed. They should not hang down onto the floor. Have a firm chair that has side arms. You can use this for support while you get dressed. Do not have throw rugs and other things on the floor that can make you trip. What can I do in the kitchen? Clean up any spills right away. Avoid walking on wet floors. Keep items that you use a lot in easy-to-reach places. If you need to reach something above you, use a strong step stool that has a grab bar. Keep electrical cords out of the way. Do not use floor polish or wax that makes floors slippery. If you must use wax, use non-skid floor wax. Do not have throw rugs and other things on the floor that can make you  trip. What can I do with my stairs? Do not leave any items on the stairs. Make sure that there are handrails on both sides of the stairs and use them. Fix handrails that are broken or loose. Make sure that handrails are as long as the stairways. Check any carpeting to make sure that it is firmly attached to the stairs. Fix any carpet that is loose or worn. Avoid having throw rugs at the top or bottom of the stairs. If you do have throw rugs, attach them to the floor with carpet tape. Make sure that you have a light switch at the top of the stairs and the bottom of the stairs. If you do not have them, ask someone to add them for you. What else can I do to help prevent  falls? Wear shoes that: Do not have high heels. Have rubber bottoms. Are comfortable and fit you well. Are closed at the toe. Do not wear sandals. If you use a stepladder: Make sure that it is fully opened. Do not climb a closed stepladder. Make sure that both sides of the stepladder are locked into place. Ask someone to hold it for you, if possible. Clearly mark and make sure that you can see: Any grab bars or handrails. First and last steps. Where the edge of each step is. Use tools that help you move around (mobility aids) if they are needed. These include: Canes. Walkers. Scooters. Crutches. Turn on the lights when you go into a dark area. Replace any light bulbs as soon as they burn out. Set up your furniture so you have a clear path. Avoid moving your furniture around. If any of your floors are uneven, fix them. If there are any pets around you, be aware of where they are. Review your medicines with your doctor. Some medicines can make you feel dizzy. This can increase your chance of falling. Ask your doctor what other things that you can do to help prevent falls. This information is not intended to replace advice given to you by your health care provider. Make sure you discuss any questions you have with your health care provider. Document Released: 08/19/2009 Document Revised: 03/30/2016 Document Reviewed: 11/27/2014 Elsevier Interactive Patient Education  2017 [employer].

## 2022-01-03 DIAGNOSIS — Z1211 Encounter for screening for malignant neoplasm of colon: Secondary | ICD-10-CM | POA: Diagnosis not present
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
## 2022-01-09 ENCOUNTER — Encounter: Payer: Self-pay | Admitting: Physician Assistant

## 2022-01-09 ENCOUNTER — Other Ambulatory Visit: Payer: Self-pay

## 2022-01-09 ENCOUNTER — Ambulatory Visit (INDEPENDENT_AMBULATORY_CARE_PROVIDER_SITE_OTHER): Payer: Medicare HMO | Admitting: Physician Assistant

## 2022-01-09 VITALS — BP 140/80 | HR 87 | Ht 68.0 in | Wt 125.4 lb

## 2022-01-09 DIAGNOSIS — Z8249 Family history of ischemic heart disease and other diseases of the circulatory system: Secondary | ICD-10-CM

## 2022-01-09 DIAGNOSIS — E7800 Pure hypercholesterolemia, unspecified: Secondary | ICD-10-CM

## 2022-01-09 DIAGNOSIS — D508 Other iron deficiency anemias: Secondary | ICD-10-CM

## 2022-01-09 DIAGNOSIS — Z1159 Encounter for screening for other viral diseases: Secondary | ICD-10-CM | POA: Diagnosis not present

## 2022-01-09 DIAGNOSIS — N951 Menopausal and female climacteric states: Secondary | ICD-10-CM

## 2022-01-09 DIAGNOSIS — I10 Essential (primary) hypertension: Secondary | ICD-10-CM

## 2022-01-09 DIAGNOSIS — Z114 Encounter for screening for human immunodeficiency virus [HIV]: Secondary | ICD-10-CM

## 2022-01-09 DIAGNOSIS — Z0000 Encounter for general adult medical examination without abnormal findings: Secondary | ICD-10-CM

## 2022-01-09 HISTORY — DX: Family history of ischemic heart disease and other diseases of the circulatory system: Z82.49

## 2022-01-09 LAB — COLOGUARD: COLOGUARD: NEGATIVE

## 2022-01-09 MED ORDER — LOSARTAN POTASSIUM-HCTZ 50-12.5 MG PO TABS: 1.0000 | ORAL_TABLET | Freq: Every day | ORAL | 3 refills | Status: DC

## 2022-01-09 NOTE — Patient Instructions (Addendum)
For high cholesterol - Increase fiber intake (Benefiber or Metamucil, Cherrios,  oatmeal, beans, nuts, fruits and vegetables), limit saturated fats (in fried foods, red meat), can add OTC fish oil supplement, eat fish with Omega-3 fatty acids like salmon and tuna, exercise for 30 minutes 3 - 5 times a week, drink 8 - 10 glasses of water a day. ? ?From the early 40s to early 8s women can have perimenopausal symptoms as hormone levels in the body change. Perimenopause is the stage leading up to menopause. The symptoms can start gradually or all of a sudden at any age and can last for a various amount of time, from months to years. There is no quick fix for these symptoms.  ?(The definition of menopause if no menstrual cycle for 12 months in a row. The average age for menopause is usually age 18 or 3.) ? ?Each person can have any or all of the following symptoms: ?Fatigue ?Hot flashes  ?Night sweats ?Abdominal bloating ?Trouble falling and staying asleep ?Mood swings ?Brain fog ?Aches and pains ?Heart palpitations ?Sore breasts ?Weight gain, especially the in the midsection/belly ?Dry, itchy skin including vaginal dryness ?Decreased sexual drive ?Headaches ?Overthinking ?Anxiety ?Depression ? ?There are several over the counter substances that can help the symptoms. Some supplements combine some of the below ingredients. ? ?Over the counter supplements for natural hormonal support: ?Wild yam ?Evening primrose oil ?Black cohosh ?Magnolia bark ?Omega 3s ?DIM ?Magnolia bark ?Red clover ?Chastetree berry ?Maudry Mayhew ? ?Over the counter pamabrom, simethicone, ginger root, probiotics, digestive enzymes, Vitamin D, peppermint oil, or cinnamon oil to help with bloating. ? ?Over the counter magnesium for sleep disturbances and constipation. ? ?Over the counter Ashwagandha, Ginseng or Passion flower to help with stress. ? ?You can also follow up with an ObGyn to ask to have your hormone levels checked and see if prescription  hormone replacement is appropriate for you.  ? ? ? ?

## 2022-01-10 LAB — COMPREHENSIVE METABOLIC PANEL
ALT: 12 IU/L (ref 0–32)
AST: 16 IU/L (ref 0–40)
Albumin/Globulin Ratio: 1.9 (ref 1.2–2.2)
Albumin: 5 g/dL — ABNORMAL HIGH (ref 3.8–4.8)
Alkaline Phosphatase: 76 IU/L (ref 44–121)
BUN/Creatinine Ratio: 9 (ref 9–23)
BUN: 6 mg/dL (ref 6–24)
Bilirubin Total: 0.5 mg/dL (ref 0.0–1.2)
CO2: 16 mmol/L — ABNORMAL LOW (ref 20–29)
Calcium: 10.7 mg/dL — ABNORMAL HIGH (ref 8.7–10.2)
Chloride: 105 mmol/L (ref 96–106)
Creatinine, Ser: 0.65 mg/dL (ref 0.57–1.00)
Globulin, Total: 2.7 g/dL (ref 1.5–4.5)
Glucose: 89 mg/dL (ref 70–99)
Potassium: 4.4 mmol/L (ref 3.5–5.2)
Sodium: 144 mmol/L (ref 134–144)
Total Protein: 7.7 g/dL (ref 6.0–8.5)
eGFR: 107 mL/min/{1.73_m2} (ref >59)

## 2022-01-10 LAB — CBC WITH DIFFERENTIAL/PLATELET
Basophils Absolute: 0.1 10*3/uL (ref 0.0–0.2)
Basos: 1 %
EOS (ABSOLUTE): 0 10*3/uL (ref 0.0–0.4)
Eos: 1 %
Hematocrit: 46.2 % (ref 34.0–46.6)
Hemoglobin: 15.1 g/dL (ref 11.1–15.9)
Immature Grans (Abs): 0 10*3/uL (ref 0.0–0.1)
Immature Granulocytes: 0 %
Lymphocytes Absolute: 0.9 10*3/uL (ref 0.7–3.1)
Lymphs: 21 %
MCH: 32.5 pg (ref 26.6–33.0)
MCHC: 32.7 g/dL (ref 31.5–35.7)
MCV: 100 fL — ABNORMAL HIGH (ref 79–97)
Monocytes Absolute: 0.4 10*3/uL (ref 0.1–0.9)
Monocytes: 9 %
Neutrophils Absolute: 3.1 10*3/uL (ref 1.4–7.0)
Neutrophils: 68 %
Platelets: 254 10*3/uL (ref 150–450)
RBC: 4.64 x10E6/uL (ref 3.77–5.28)
RDW: 11.6 % — ABNORMAL LOW (ref 11.7–15.4)
WBC: 4.6 10*3/uL (ref 3.4–10.8)

## 2022-01-10 LAB — LIPID PANEL
Chol/HDL Ratio: 2.9 ratio (ref 0.0–4.4)
Cholesterol, Total: 179 mg/dL (ref 100–199)
HDL: 61 mg/dL (ref >39)
LDL Chol Calc (NIH): 105 mg/dL — ABNORMAL HIGH (ref 0–99)
Triglycerides: 71 mg/dL (ref 0–149)
VLDL Cholesterol Cal: 13 mg/dL (ref 5–40)

## 2022-01-10 LAB — HIV ANTIBODY (ROUTINE TESTING W REFLEX)

## 2022-01-10 LAB — HEPATITIS C ANTIBODY: Hep C Virus Ab: NONREACTIVE

## 2022-02-01 ENCOUNTER — Other Ambulatory Visit: Payer: Self-pay | Admitting: Physician Assistant

## 2022-02-01 DIAGNOSIS — Z1231 Encounter for screening mammogram for malignant neoplasm of breast: Secondary | ICD-10-CM

## 2022-02-14 NOTE — Addendum Note (Signed)
Addended by: Francis Gaines on: 02/14/2022 11:03 AM ? ? Modules accepted: Level of Service ? ?

## 2022-03-15 ENCOUNTER — Ambulatory Visit
Admission: RE | Admit: 2022-03-15 | Discharge: 2022-03-15 | Disposition: A | Discharge status: Home / Self Care | Payer: Medicare HMO | Source: Ambulatory Visit | Attending: Physician Assistant | Admitting: Physician Assistant

## 2022-03-15 DIAGNOSIS — Z1231 Encounter for screening mammogram for malignant neoplasm of breast: Secondary | ICD-10-CM

## 2022-07-12 ENCOUNTER — Encounter: Payer: Self-pay | Admitting: Internal Medicine

## 2022-07-12 ENCOUNTER — Ambulatory Visit: Payer: Medicare HMO | Admitting: Medical

## 2022-07-17 ENCOUNTER — Encounter: Payer: Self-pay | Admitting: Medical

## 2022-09-19 ENCOUNTER — Encounter: Payer: Self-pay | Admitting: Internal Medicine

## 2023-01-18 ENCOUNTER — Telehealth: Payer: Self-pay | Admitting: Physician Assistant

## 2023-01-18 NOTE — Telephone Encounter (Signed)
Contacted Makalia A Guidone to schedule their annual wellness visit. Appointment made for 01/23/23.  [facility] [employer] [facility] direct phone # [phone]

## 2023-01-23 ENCOUNTER — Ambulatory Visit (INDEPENDENT_AMBULATORY_CARE_PROVIDER_SITE_OTHER): Payer: Medicare HMO

## 2023-01-23 VITALS — Ht 68.0 in | Wt 125.0 lb

## 2023-01-23 DIAGNOSIS — Z0000 Encounter for general adult medical examination without abnormal findings: Secondary | ICD-10-CM | POA: Diagnosis not present
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
## 2023-01-23 NOTE — Patient Instructions (Signed)
[name] , Thank you for taking time to come for your Medicare Wellness Visit. I appreciate your ongoing commitment to your health goals. Please review the following plan we discussed and let me know if I can assist you in the future.   These are the goals we discussed:  Goals      Patient Stated     12/23/2021, wants to quit smoking and get up to 140 pounds     Patient Stated     01/23/2023, wants to start back swimming        This is a list of the screening recommended for you and due dates:  Health Maintenance  Topic Date Due   Colon Cancer Screening  Never done   Zoster (Shingles) Vaccine (1 of 2) Never done   Flu Shot  06/06/2022   Medicare Annual Wellness Visit  01/23/2024   Mammogram  03/15/2024   DTaP/Tdap/Td vaccine (2 - Td or Tdap) 05/15/2028   Hepatitis C Screening: USPSTF Recommendation to screen - Ages 94-79 yo.  Completed   HIV Screening  Completed   HPV Vaccine  Aged Out   Pap Smear  Discontinued   COVID-19 Vaccine  Discontinued    Advanced directives: copy in chart  Conditions/risks identified: smoking  Next appointment: Follow up in one year for your annual wellness visit.   Preventive Care 40-64 Years, Female Preventive care refers to lifestyle choices and visits with your health care provider that can promote health and wellness. What does preventive care include? A yearly physical exam. This is also called an annual well check. Dental exams once or twice a year. Routine eye exams. Ask your health care provider how often you should have your eyes checked. Personal lifestyle choices, including: Daily care of your teeth and gums. Regular physical activity. Eating a healthy diet. Avoiding tobacco and drug use. Limiting alcohol use. Practicing safe sex. Taking low-dose aspirin daily starting at age 61. Taking vitamin and mineral supplements as recommended by your health care provider. What happens during an annual well check? The services and  screenings done by your health care provider during your annual well check will depend on your age, overall health, lifestyle risk factors, and family history of disease. Counseling  Your health care provider may ask you questions about your: Alcohol use. Tobacco use. Drug use. Emotional well-being. Home and relationship well-being. Sexual activity. Eating habits. Work and work Statistician. Method of birth control. Menstrual cycle. Pregnancy history. Screening  You may have the following tests or measurements: Height, weight, and BMI. Blood pressure. Lipid and cholesterol levels. These may be checked every 5 years, or more frequently if you are over 38 years old. Skin check. Lung cancer screening. You may have this screening every year starting at age 101 if you have a 30-pack-year history of smoking and currently smoke or have quit within the past 15 years. Fecal occult blood test (FOBT) of the stool. You may have this test every year starting at age 35. Flexible sigmoidoscopy or colonoscopy. You may have a sigmoidoscopy every 5 years or a colonoscopy every 10 years starting at age 29. Hepatitis C blood test. Hepatitis B blood test. Sexually transmitted disease (STD) testing. Diabetes screening. This is done by checking your blood sugar (glucose) after you have not eaten for a while (fasting). You may have this done every 1-3 years. Mammogram. This may be done every 1-2 years. Talk to your health care provider about when you should start having regular mammograms. This  may depend on whether you have a family history of breast cancer. BRCA-related cancer screening. This may be done if you have a family history of breast, ovarian, tubal, or peritoneal cancers. Pelvic exam and Pap test. This may be done every 3 years starting at age 76. Starting at age 53, this may be done every 5 years if you have a Pap test in combination with an HPV test. Bone density scan. This is done to screen for  osteoporosis. You may have this scan if you are at high risk for osteoporosis. Discuss your test results, treatment options, and if necessary, the need for more tests with your health care provider. Vaccines  Your health care provider may recommend certain vaccines, such as: Influenza vaccine. This is recommended every year. Tetanus, diphtheria, and acellular pertussis (Tdap, Td) vaccine. You may need a Td booster every 10 years. Zoster vaccine. You may need this after age 51. Pneumococcal 13-valent conjugate (PCV13) vaccine. You may need this if you have certain conditions and were not previously vaccinated. Pneumococcal polysaccharide (PPSV23) vaccine. You may need one or two doses if you smoke cigarettes or if you have certain conditions. Talk to your health care provider about which screenings and vaccines you need and how often you need them. This information is not intended to replace advice given to you by your health care provider. Make sure you discuss any questions you have with your health care provider. Document Released: 11/19/2015 Document Revised: 07/12/2016 Document Reviewed: 08/24/2015 Elsevier Interactive Patient Education  2017 [employer] Prevention in the Home Falls can cause injuries. They can happen to people of all ages. There are many things you can do to make your home safe and to help prevent falls. What can I do on the outside of my home? Regularly fix the edges of walkways and driveways and fix any cracks. Remove anything that might make you trip as you walk through a door, such as a raised step or threshold. Trim any bushes or trees on the path to your home. Use bright outdoor lighting. Clear any walking paths of anything that might make someone trip, such as rocks or tools. Regularly check to see if handrails are loose or broken. Make sure that both sides of any steps have handrails. Any raised decks and porches should have guardrails on the  edges. Have any leaves, snow, or ice cleared regularly. Use sand or salt on walking paths during winter. Clean up any spills in your garage right away. This includes oil or grease spills. What can I do in the bathroom? Use night lights. Install grab bars by the toilet and in the tub and shower. Do not use towel bars as grab bars. Use non-skid mats or decals in the tub or shower. If you need to sit down in the shower, use a plastic, non-slip stool. Keep the floor dry. Clean up any water that spills on the floor as soon as it happens. Remove soap buildup in the tub or shower regularly. Attach bath mats securely with double-sided non-slip rug tape. Do not have throw rugs and other things on the floor that can make you trip. What can I do in the bedroom? Use night lights. Make sure that you have a light by your bed that is easy to reach. Do not use any sheets or blankets that are too big for your bed. They should not hang down onto the floor. Have a firm chair that has side arms. You  can use this for support while you get dressed. Do not have throw rugs and other things on the floor that can make you trip. What can I do in the kitchen? Clean up any spills right away. Avoid walking on wet floors. Keep items that you use a lot in easy-to-reach places. If you need to reach something above you, use a strong step stool that has a grab bar. Keep electrical cords out of the way. Do not use floor polish or wax that makes floors slippery. If you must use wax, use non-skid floor wax. Do not have throw rugs and other things on the floor that can make you trip. What can I do with my stairs? Do not leave any items on the stairs. Make sure that there are handrails on both sides of the stairs and use them. Fix handrails that are broken or loose. Make sure that handrails are as long as the stairways. Check any carpeting to make sure that it is firmly attached to the stairs. Fix any carpet that is loose or  worn. Avoid having throw rugs at the top or bottom of the stairs. If you do have throw rugs, attach them to the floor with carpet tape. Make sure that you have a light switch at the top of the stairs and the bottom of the stairs. If you do not have them, ask someone to add them for you. What else can I do to help prevent falls? Wear shoes that: Do not have high heels. Have rubber bottoms. Are comfortable and fit you well. Are closed at the toe. Do not wear sandals. If you use a stepladder: Make sure that it is fully opened. Do not climb a closed stepladder. Make sure that both sides of the stepladder are locked into place. Ask someone to hold it for you, if possible. Clearly mark and make sure that you can see: Any grab bars or handrails. First and last steps. Where the edge of each step is. Use tools that help you move around (mobility aids) if they are needed. These include: Canes. Walkers. Scooters. Crutches. Turn on the lights when you go into a dark area. Replace any light bulbs as soon as they burn out. Set up your furniture so you have a clear path. Avoid moving your furniture around. If any of your floors are uneven, fix them. If there are any pets around you, be aware of where they are. Review your medicines with your doctor. Some medicines can make you feel dizzy. This can increase your chance of falling. Ask your doctor what other things that you can do to help prevent falls. This information is not intended to replace advice given to you by your health care provider. Make sure you discuss any questions you have with your health care provider. Document Released: 08/19/2009 Document Revised: 03/30/2016 Document Reviewed: 11/27/2014 Elsevier Interactive Patient Education  2017 [employer].

## 2023-02-20 ENCOUNTER — Encounter: Payer: Self-pay | Admitting: Nurse Practitioner

## 2023-02-20 ENCOUNTER — Ambulatory Visit (INDEPENDENT_AMBULATORY_CARE_PROVIDER_SITE_OTHER): Payer: Medicare HMO | Admitting: Nurse Practitioner

## 2023-02-20 VITALS — BP 132/80 | HR 77 | Ht 69.0 in | Wt 121.8 lb

## 2023-02-20 DIAGNOSIS — Z720 Tobacco use: Secondary | ICD-10-CM

## 2023-02-20 DIAGNOSIS — D508 Other iron deficiency anemias: Secondary | ICD-10-CM

## 2023-02-20 DIAGNOSIS — I10 Essential (primary) hypertension: Secondary | ICD-10-CM | POA: Diagnosis not present

## 2023-02-20 DIAGNOSIS — Z0000 Encounter for general adult medical examination without abnormal findings: Secondary | ICD-10-CM | POA: Diagnosis not present

## 2023-02-20 DIAGNOSIS — E7800 Pure hypercholesterolemia, unspecified: Secondary | ICD-10-CM | POA: Diagnosis not present

## 2023-02-20 DIAGNOSIS — Z23 Encounter for immunization: Secondary | ICD-10-CM | POA: Diagnosis not present

## 2023-02-20 DIAGNOSIS — Z681 Body mass index (BMI) 19 or less, adult: Secondary | ICD-10-CM

## 2023-02-20 DIAGNOSIS — F4321 Adjustment disorder with depressed mood: Secondary | ICD-10-CM | POA: Diagnosis not present

## 2023-02-20 LAB — LP+LDL DIRECT

## 2023-02-20 MED ORDER — LOSARTAN POTASSIUM-HCTZ 50-12.5 MG PO TABS: 1.0000 | ORAL_TABLET | Freq: Every day | ORAL | 3 refills | Status: DC

## 2023-02-20 MED ORDER — ZOSTER VAC RECOMB ADJUVANTED 50 MCG/0.5ML IM SUSR
0.5000 mL | Freq: Once | INTRAMUSCULAR | 1 refills | Status: AC
Start: 2023-02-20 — End: 2023-02-20
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
## 2023-02-20 NOTE — Patient Instructions (Signed)
I would like you to keep an eye on your blood pressure. 3-4 days a week I would like you to check your blood pressure at home and see where it is. Your goal is less than 130/80. If it is running higher than that, we can plan to make some changes to your medication.  I would like to check back with you in about 3 months to see how your blood pressure is looking and go over the readings you have from home. We can determine then if we need to increase the dose or if we are good. We can do this virtually, if you would like.

## 2023-02-21 LAB — CBC WITH DIFFERENTIAL/PLATELET
Basophils Absolute: 0.1 10*3/uL (ref 0.0–0.2)
Basos: 2 %
EOS (ABSOLUTE): 0 10*3/uL (ref 0.0–0.4)
Eos: 1 %
Hematocrit: 46.8 % — ABNORMAL HIGH (ref 34.0–46.6)
Hemoglobin: 15.1 g/dL (ref 11.1–15.9)
Immature Grans (Abs): 0 10*3/uL (ref 0.0–0.1)
Immature Granulocytes: 0 %
Lymphocytes Absolute: 0.8 10*3/uL (ref 0.7–3.1)
Lymphs: 21 %
MCH: 32 pg (ref 26.6–33.0)
MCHC: 32.3 g/dL (ref 31.5–35.7)
MCV: 99 fL — ABNORMAL HIGH (ref 79–97)
Monocytes Absolute: 0.4 10*3/uL (ref 0.1–0.9)
Monocytes: 10 %
Neutrophils Absolute: 2.6 10*3/uL (ref 1.4–7.0)
Neutrophils: 66 %
Platelets: 227 10*3/uL (ref 150–450)
RBC: 4.72 x10E6/uL (ref 3.77–5.28)
RDW: 11.8 % (ref 11.7–15.4)
WBC: 3.9 10*3/uL (ref 3.4–10.8)

## 2023-02-21 LAB — COMPREHENSIVE METABOLIC PANEL
ALT: 15 IU/L (ref 0–32)
AST: 18 IU/L (ref 0–40)
Albumin/Globulin Ratio: 1.8 (ref 1.2–2.2)
Albumin: 4.6 g/dL (ref 3.8–4.9)
Alkaline Phosphatase: 92 IU/L (ref 44–121)
BUN/Creatinine Ratio: 8 — ABNORMAL LOW (ref 9–23)
BUN: 5 mg/dL — ABNORMAL LOW (ref 6–24)
Bilirubin Total: 0.7 mg/dL (ref 0.0–1.2)
CO2: 21 mmol/L (ref 20–29)
Calcium: 10 mg/dL (ref 8.7–10.2)
Chloride: 102 mmol/L (ref 96–106)
Creatinine, Ser: 0.64 mg/dL (ref 0.57–1.00)
Globulin, Total: 2.6 g/dL (ref 1.5–4.5)
Glucose: 72 mg/dL (ref 70–99)
Potassium: 4.2 mmol/L (ref 3.5–5.2)
Sodium: 138 mmol/L (ref 134–144)
Total Protein: 7.2 g/dL (ref 6.0–8.5)
eGFR: 107 mL/min/{1.73_m2} (ref >59)

## 2023-02-21 LAB — LP+LDL DIRECT
Cholesterol, Total: 158 mg/dL (ref 100–199)
HDL: 69 mg/dL (ref >39)
LDL Chol Calc (NIH): 76 mg/dL (ref 0–99)
LDL Direct: 79 mg/dL (ref 0–99)
Triglycerides: 64 mg/dL (ref 0–149)
VLDL Cholesterol Cal: 13 mg/dL (ref 5–40)

## 2023-02-22 ENCOUNTER — Encounter: Payer: Self-pay | Admitting: Nurse Practitioner

## 2023-02-22 DIAGNOSIS — Z0000 Encounter for general adult medical examination without abnormal findings: Secondary | ICD-10-CM | POA: Insufficient documentation

## 2023-02-22 DIAGNOSIS — Z681 Body mass index (BMI) 19 or less, adult: Secondary | ICD-10-CM | POA: Insufficient documentation

## 2023-02-22 DIAGNOSIS — F4320 Adjustment disorder, unspecified: Secondary | ICD-10-CM | POA: Insufficient documentation

## 2023-02-22 DIAGNOSIS — F4321 Adjustment disorder with depressed mood: Secondary | ICD-10-CM | POA: Insufficient documentation

## 2023-02-22 NOTE — Assessment & Plan Note (Signed)
Smoking cessation is recommended.  Plan: - When you are ready to quit, please let me know if you would like to try medication to help.

## 2023-02-22 NOTE — Assessment & Plan Note (Signed)
The patient is dealing with grief and emotional stress following the recent passing of her sister-in-law. We discussed health ways she can celebrate her sister in law and the relationship she had with her, as well as supporting her spouse during his time of grief.  Plan: - Encourage the patient to support her spouse by sharing memories and expressing emotions. - Recommend activities that support mental health, such as physical exercise and maintaining a regular routine.

## 2023-02-22 NOTE — Assessment & Plan Note (Signed)
Labs pending.  

## 2023-02-22 NOTE — Assessment & Plan Note (Signed)
The patient's current regimen includes Losartan-hydrochlorothiazide. Noted high blood pressure during the visit, which may be attributed to anxiety or white coat syndrome. Plan: - Re-evaluate blood pressure prior to the patient's departure from the clinic. - Advise the patient to monitor blood pressure at home and report any persistently high readings (>130/80). - Consider medication adjustment if persistent hypertension is observed.

## 2023-02-22 NOTE — Assessment & Plan Note (Signed)

## 2023-02-22 NOTE — Assessment & Plan Note (Signed)
[name] expresses a desire to gain weight. Her current BMI is 17.99. Plan: - Incorporate high protein shakes into your daily routine to increase health calories and to help build muscle - Avoid skipping meals - Be sure you are taking in enough calories for the amount of exercise you do

## 2024-01-29 ENCOUNTER — Ambulatory Visit: Payer: Medicare HMO

## 2024-01-29 DIAGNOSIS — Z0000 Encounter for general adult medical examination without abnormal findings: Secondary | ICD-10-CM | POA: Diagnosis not present

## 2024-01-29 NOTE — Patient Instructions (Signed)
 Ms. Mahnken , Thank you for taking time to come for your Medicare Wellness Visit. I appreciate your ongoing commitment to your health goals. Please review the following plan we discussed and let me know if I can assist you in the future.   Referrals/Orders/Follow-Ups/Clinician Recommendations: none  This is a list of the screening recommended for you and due dates:  Health Maintenance  Topic Date Due   Pneumococcal Vaccination (1 of 2 - PCV) Never done   HIV Screening  Never done   Zoster (Shingles) Vaccine (1 of 2) Never done   Pap with HPV screening  10/05/2022   Flu Shot  06/07/2023   Mammogram  03/15/2024   Cologuard (Stool DNA test)  01/03/2025   Medicare Annual Wellness Visit  01/28/2025   DTaP/Tdap/Td vaccine (2 - Td or Tdap) 05/15/2028   Hepatitis C Screening  Completed   HPV Vaccine  Aged Out   COVID-19 Vaccine  Discontinued    Advanced directives: (In Chart) A copy of your advanced directives are scanned into your chart should your provider ever need it.  Next Medicare Annual Wellness Visit scheduled for next year: Yes  insert Preventive Care attachment Insert FALL PREVENTION attachment if needed
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
## 2024-03-17 ENCOUNTER — Other Ambulatory Visit: Payer: Self-pay | Admitting: Nurse Practitioner

## 2024-03-17 DIAGNOSIS — I10 Essential (primary) hypertension: Secondary | ICD-10-CM

## 2024-06-03 NOTE — Progress Notes (Unsigned)
 Last PAP: Mammo:

## 2024-06-04 ENCOUNTER — Encounter: Payer: Self-pay | Admitting: Nurse Practitioner

## 2024-06-04 ENCOUNTER — Ambulatory Visit (INDEPENDENT_AMBULATORY_CARE_PROVIDER_SITE_OTHER): Admitting: Nurse Practitioner

## 2024-06-04 VITALS — BP 124/84 | HR 64 | Wt 117.0 lb

## 2024-06-04 DIAGNOSIS — Z1231 Encounter for screening mammogram for malignant neoplasm of breast: Secondary | ICD-10-CM | POA: Insufficient documentation

## 2024-06-04 DIAGNOSIS — I10 Essential (primary) hypertension: Secondary | ICD-10-CM

## 2024-06-04 DIAGNOSIS — E7800 Pure hypercholesterolemia, unspecified: Secondary | ICD-10-CM | POA: Diagnosis not present

## 2024-06-04 DIAGNOSIS — R7301 Impaired fasting glucose: Secondary | ICD-10-CM | POA: Diagnosis not present

## 2024-06-04 LAB — LIPID PANEL

## 2024-06-04 LAB — CBC WITH DIFF/PLATELET

## 2024-06-04 MED ORDER — LOSARTAN POTASSIUM-HCTZ 50-12.5 MG PO TABS: 1.0000 | ORAL_TABLET | Freq: Every day | ORAL | 3 refills | Status: AC

## 2024-06-04 NOTE — Patient Instructions (Addendum)
 VISIT SUMMARY:  Today, you came in for a follow-up visit to check your labs and discuss vaccinations. Your blood pressure is well-controlled with your current medication, and you have no symptoms like headaches, dizziness, vision changes, shortness of breath, or swelling in your feet. We also talked about some important vaccinations that you are due for.  YOUR PLAN:  -ESSENTIAL HYPERTENSION: Essential hypertension means high blood pressure without a known secondary cause. Your blood pressure is well-controlled with your current medication, and you are not experiencing any related symptoms. Continue taking your current medication as prescribed. We will also check your blood work to assess your kidney function.  -GENERAL HEALTH MAINTENANCE: You are due for a mammogram, which is an important screening tool for breast cancer. You have not received the hepatitis B, pneumonia, or shingles vaccines. We discussed the importance of the pneumonia and shingles vaccines, especially given your age and potential health conditions. The pneumonia vaccine is recommended for those over 60 or with certain health conditions, and it helps prevent serious complications from pneumonia. The shingles vaccine helps prevent painful outbreaks and potential complications like vision or hearing damage. You can get these vaccines at our clinic or a pharmacy at your convenience.  INSTRUCTIONS:  Please continue your current blood pressure medication and get your blood work done to assess kidney function. Schedule a mammogram and consider getting the pneumonia and shingles vaccines either at our clinic or a pharmacy.    Recombinant Zoster (Shingles) Vaccine: What You Need to Know Many vaccine information statements are available in Spanish and other languages. See PromoAge.com.br. 1. Why get vaccinated? Recombinant zoster (shingles) vaccine can prevent shingles. Shingles (also called herpes zoster, or just zoster) is a  painful skin rash, usually with blisters. In addition to the rash, shingles can cause fever, headache, chills, or upset stomach. Rarely, shingles can lead to complications such as pneumonia, hearing problems, blindness, brain inflammation (encephalitis), or death. The risk of shingles increases with age. The most common complication of shingles is long-term nerve pain called postherpetic neuralgia (PHN). PHN occurs in the areas where the shingles rash was and can last for months or years after the rash goes away. The pain from PHN can be severe and debilitating. The risk of PHN increases with age. An older adult with shingles is more likely to develop PHN and have longer lasting and more severe pain than a younger person. People with weakened immune systems also have a higher risk of getting shingles and complications from the disease. Shingles is caused by varicella-zoster virus, the same virus that causes chickenpox. After you have chickenpox, the virus stays in your body and can cause shingles later in life. Shingles cannot be passed from one person to another, but the virus that causes shingles can spread and cause chickenpox in someone who has never had chickenpox or has never received chickenpox vaccine. 2. Recombinant shingles vaccine Recombinant shingles vaccine provides strong protection against shingles. By preventing shingles, recombinant shingles vaccine also protects against PHN and other complications. Recombinant shingles vaccine is recommended for: Adults 50 years and older Adults 19 years and older who have a weakened immune system because of disease or treatments Shingles vaccine is given as a two-dose series. For most people, the second dose should be given 2 to 6 months after the first dose. Some people who have or will have a weakened immune system can get the second dose 1 to 2 months after the first dose. Ask your health care provider for  guidance. People who have had shingles in  the past and people who have received varicella (chickenpox) vaccine are recommended to get recombinant shingles vaccine. The vaccine is also recommended for people who have already gotten another type of shingles vaccine, the live shingles vaccine. There is no live virus in recombinant shingles vaccine. Shingles vaccine may be given at the same time as other vaccines. 3. Talk with your health care provider Tell your vaccination provider if the person getting the vaccine: Has had an allergic reaction after a previous dose of recombinant shingles vaccine, or has any severe, life-threatening allergies Is currently experiencing an episode of shingles Is pregnant In some cases, your health care provider may decide to postpone shingles vaccination until a future visit. People with minor illnesses, such as a cold, may be vaccinated. People who are moderately or severely ill should usually wait until they recover before getting recombinant shingles vaccine. Your health care provider can give you more information. 4. Risks of a vaccine reaction A sore arm with mild or moderate pain is very common after recombinant shingles vaccine. Redness and swelling can also happen at the site of the injection. Tiredness, muscle pain, headache, shivering, fever, stomach pain, and nausea are common after recombinant shingles vaccine. These side effects may temporarily prevent a vaccinated person from doing regular activities. Symptoms usually go away on their own in 2 to 3 days. You should still get the second dose of recombinant shingles vaccine even if you had one of these reactions after the first dose. Guillain-Barr syndrome (GBS), a serious nervous system disorder, has been reported very rarely after recombinant zoster vaccine. People sometimes faint after medical procedures, including vaccination. Tell your provider if you feel dizzy or have vision changes or ringing in the ears. As with any medicine, there is a  very remote chance of a vaccine causing a severe allergic reaction, other serious injury, or death. 5. What if there is a serious problem? An allergic reaction could occur after the vaccinated person leaves the clinic. If you see signs of a severe allergic reaction (hives, swelling of the face and throat, difficulty breathing, a fast heartbeat, dizziness, or weakness), call 9-1-1 and get the person to the nearest hospital. For other signs that concern you, call your health care provider. Adverse reactions should be reported to the Vaccine Adverse Event Reporting System (VAERS). Your health care provider will usually file this report, or you can do it yourself. Visit the VAERS website at www.vaers.LAgents.no or call (727)032-6246. VAERS is only for reporting reactions, and VAERS staff members do not give medical advice. 6. How can I learn more? Ask your health care provider. Call your local or state health department. Visit the website of the Food and Drug Administration (FDA) for vaccine package inserts and additional information at GoldCloset.com.ee. Contact the Centers for Disease Control and Prevention (CDC): Call 4325628859 (1-800-CDC-INFO) or Visit CDC's website at PicCapture.uy. Source: CDC Vaccine Information Statement Recombinant Zoster Vaccine (12/10/2020) This same material is available at FootballExhibition.com.br for no charge. This information is not intended to replace advice given to you by your health care provider. Make sure you discuss any questions you have with your health care provider. Document Revised: 02/07/2023 Document Reviewed: 11/08/2022 Elsevier Patient Education  2024 [employer] Inc.   Pneumococcal Conjugate Vaccine: What You Need to Know Many vaccine information statements are available in Spanish and other languages. See PromoAge.com.br. 1. Why get vaccinated? Pneumococcal conjugate vaccine can prevent pneumococcal disease. Pneumococcal  disease refers to  any illness caused by pneumococcal bacteria. These bacteria can cause many types of illnesses, including pneumonia, which is an infection of the lungs. Pneumococcal bacteria are one of the most common causes of pneumonia. Besides pneumonia, pneumococcal bacteria can also cause: Ear infections Sinus infections Meningitis (infection of the tissue covering the brain and spinal cord) Bacteremia (infection of the blood) Anyone can get pneumococcal disease, but children under 94 years old, people with certain medical conditions or other risk factors, and adults 65 years or older are at the highest risk. Most pneumococcal infections are mild. However, some can result in long-term problems, such as brain damage or hearing loss. Meningitis, bacteremia, and pneumonia caused by pneumococcal disease can be fatal. 2. Pneumococcal conjugate vaccine Pneumococcal conjugate vaccine helps protect against bacteria that cause pneumococcal disease. There are three pneumococcal conjugate vaccines (PCV13, PCV15, and PCV20). The different vaccines are recommended for different people based on age and medical status. Your health care provider can help you determine which type of pneumococcal conjugate vaccine, and how many doses, you should receive. Infants and young children usually need 4 doses of pneumococcal conjugate vaccine. These doses are recommended at 2, 4, 6, and 45-42 months of age. Older children and adolescents might need pneumococcal conjugate vaccine depending on their age and medical conditions or other risk factors if they did not receive the recommended doses as infants or young children. Adults 19 through 52 years old with certain medical conditions or other risk factors who have not already received pneumococcal conjugate vaccine should receive pneumococcal conjugate vaccine. Adults 65 years or older who have not previously received pneumococcal conjugate vaccine should receive pneumococcal  conjugate vaccine. Some people with certain medical conditions are also recommended to receive pneumococcal polysaccharide vaccine (a different type of pneumococcal vaccine known as PPSV23). Some adults who have previously received a pneumococcal conjugate vaccine may be recommended to receive another pneumococcal conjugate vaccine. 3. Talk with your health care provider Tell your vaccination provider if the person getting the vaccine: Has had an allergic reaction after a previous dose of any type of pneumococcal conjugate vaccine (PCV13, PCV15, PCV20, or an earlier pneumococcal conjugate vaccine known as PCV7), or to any vaccine containing diphtheria toxoid (for example, DTaP), or has any severe, life-threatening allergies In some cases, your health care provider may decide to postpone pneumococcal conjugate vaccination until a future visit. People with minor illnesses, such as a cold, may be vaccinated. People who are moderately or severely ill should usually wait until they recover. Your health care provider can give you more information. 4. Risks of a vaccine reaction Redness, swelling, pain, or tenderness where the shot is given, and fever, loss of appetite, fussiness (irritability), feeling tired, headache, muscle aches, joint pain, and chills can happen after pneumococcal conjugate vaccination. Young children may be at increased risk for seizures caused by fever after a pneumococcal conjugate vaccine if it is administered at the same time as inactivated influenza vaccine. Ask your health care provider for more information. People sometimes faint after medical procedures, including vaccination. Tell your provider if you feel dizzy or have vision changes or ringing in the ears. As with any medicine, there is a very remote chance of a vaccine causing a severe allergic reaction, other serious injury, or death. 5. What if there is a serious problem? An allergic reaction could occur after the  vaccinated person leaves the clinic. If you see signs of a severe allergic reaction (hives, swelling of the face and throat, difficulty  breathing, a fast heartbeat, dizziness, or weakness), call 9-1-1 and get the person to the nearest hospital. For other signs that concern you, call your health care provider. Adverse reactions should be reported to the Vaccine Adverse Event Reporting System (VAERS). Your health care provider will usually file this report, or you can do it yourself. Visit the VAERS website at www.vaers.LAgents.no or call (254)039-5097. VAERS is only for reporting reactions, and VAERS staff members do not give medical advice. 6. The National Vaccine Injury Compensation Program The [employer] Vaccine Injury Compensation Program (VICP) is a federal program that was created to compensate people who may have been injured by certain vaccines. Claims regarding alleged injury or death due to vaccination have a time limit for filing, which may be as short as two years. Visit the VICP website at SpiritualWord.at or call 703-370-3135 to learn about the program and about filing a claim. 7. How can I learn more? Ask your health care provider. Call your local or state health department. Visit the website of the Food and Drug Administration (FDA) for vaccine package inserts and additional information at FinderList.no. Contact the Centers for Disease Control and Prevention (CDC): Call (914)816-5456 (1-800-CDC-INFO) or Visit CDC's website at PicCapture.uy. Source: CDC Vaccine Information Statement (Interim) Pneumococcal Conjugate Vaccine (03/17/2022) This same material is available at FootballExhibition.com.br for no charge. This information is not intended to replace advice given to you by your health care provider. Make sure you discuss any questions you have with your health care provider. Document Revised: 02/07/2023 Document Reviewed: 11/13/2022 Elsevier  Patient Education  2024 [employer].

## 2024-06-04 NOTE — Assessment & Plan Note (Signed)
 Repeat labs today. Currently managed with diet and exercise. Will make changes to the plan of care as necessary based on the labs.

## 2024-06-04 NOTE — Assessment & Plan Note (Signed)
 Blood pressure is well-controlled with current medication regimen. No symptoms of headaches, dizziness, vision changes, shortness of breath, or peripheral edema. - Continue current blood pressure medication regimen - Check blood work to assess kidney function

## 2024-06-05 LAB — COMPREHENSIVE METABOLIC PANEL WITH GFR
ALT: 14 IU/L (ref 0–32)
AST: 16 IU/L (ref 0–40)
Albumin: 5 g/dL — AB (ref 3.8–4.9)
Alkaline Phosphatase: 98 IU/L (ref 44–121)
BUN/Creatinine Ratio: 15 (ref 9–23)
BUN: 10 mg/dL (ref 6–24)
Bilirubin Total: 0.8 mg/dL (ref 0.0–1.2)
CO2: 23 mmol/L (ref 20–29)
Calcium: 10.4 mg/dL — ABNORMAL HIGH (ref 8.7–10.2)
Chloride: 98 mmol/L (ref 96–106)
Creatinine, Ser: 0.65 mg/dL (ref 0.57–1.00)
Globulin, Total: 2.9 g/dL (ref 1.5–4.5)
Glucose: 84 mg/dL (ref 70–99)
Potassium: 4.3 mmol/L (ref 3.5–5.2)
Sodium: 139 mmol/L (ref 134–144)
Total Protein: 7.9 g/dL (ref 6.0–8.5)
eGFR: 105 mL/min/1.73 (ref >59)

## 2024-06-05 LAB — CBC WITH DIFF/PLATELET
Basos: 1
EOS (ABSOLUTE): 0.1 x10E3/uL (ref 0.0–0.2)
Eos: 1
Hematocrit: 49.9 — AB (ref 34.0–46.6)
Hemoglobin: 16.2 g/dL — AB (ref 11.1–15.9)
Immature Granulocytes: 0
Immature Granulocytes: 0 x10E3/uL (ref 0.0–0.1)
Lymphs: 25
MCH: 32.3 pg (ref 26.6–33.0)
MCHC: 32.5 g/dL (ref 31.5–35.7)
MCV: 100 fL — AB (ref 79–97)
Monocytes Absolute: 0 x10E3/uL (ref 0.0–0.4)
Monocytes Absolute: 0.3 x10E3/uL (ref 0.1–0.9)
Monocytes: 8
Neutrophils Absolute: 0.9 x10E3/uL (ref 0.7–3.1)
Neutrophils Absolute: 2.5 x10E3/uL (ref 1.4–7.0)
Neutrophils: 65
Platelets: 239 x10E3/uL (ref 150–450)
RBC: 5.01 x10E6/uL (ref 3.77–5.28)
RDW: 11.7 (ref 11.7–15.4)
WBC: 3.8 x10E3/uL (ref 3.4–10.8)

## 2024-06-05 LAB — HEMOGLOBIN A1C
Est. average glucose Bld gHb Est-mCnc: 91 mg/dL
Hgb A1c MFr Bld: 4.8 % (ref 4.8–5.6)

## 2024-06-05 LAB — LIPID PANEL
Cholesterol, Total: 196 mg/dL (ref 100–199)
HDL: 61 mg/dL (ref >39)
LDL CALC COMMENT:: 3.2 ratio (ref 0.0–4.4)
LDL Chol Calc (NIH): 124 mg/dL — AB (ref 0–99)
Triglycerides: 62 mg/dL (ref 0–149)
VLDL Cholesterol Cal: 11 mg/dL (ref 5–40)

## 2024-06-10 ENCOUNTER — Ambulatory Visit: Payer: Self-pay | Admitting: Nurse Practitioner

## 2024-06-10 DIAGNOSIS — D582 Other hemoglobinopathies: Secondary | ICD-10-CM

## 2024-06-10 DIAGNOSIS — R718 Other abnormality of red blood cells: Secondary | ICD-10-CM

## 2024-06-23 ENCOUNTER — Ambulatory Visit
Admission: RE | Admit: 2024-06-23 | Discharge: 2024-06-23 | Disposition: A | Discharge status: Home / Self Care | Source: Ambulatory Visit | Attending: Nurse Practitioner

## 2024-06-23 DIAGNOSIS — Z1231 Encounter for screening mammogram for malignant neoplasm of breast: Secondary | ICD-10-CM

## 2024-06-25 ENCOUNTER — Other Ambulatory Visit

## 2024-06-25 DIAGNOSIS — R718 Other abnormality of red blood cells: Secondary | ICD-10-CM

## 2024-06-25 DIAGNOSIS — D582 Other hemoglobinopathies: Secondary | ICD-10-CM

## 2024-06-27 ENCOUNTER — Other Ambulatory Visit: Payer: Self-pay | Admitting: Nurse Practitioner

## 2024-06-27 DIAGNOSIS — R928 Other abnormal and inconclusive findings on diagnostic imaging of breast: Secondary | ICD-10-CM

## 2024-07-03 ENCOUNTER — Other Ambulatory Visit

## 2024-07-03 ENCOUNTER — Encounter

## 2024-07-16 ENCOUNTER — Other Ambulatory Visit

## 2024-07-16 ENCOUNTER — Encounter

## 2024-09-04 ENCOUNTER — Ambulatory Visit
Admission: RE | Admit: 2024-09-04 | Discharge: 2024-09-04 | Disposition: A | Discharge status: Home / Self Care | Source: Ambulatory Visit | Attending: Nurse Practitioner | Admitting: Nurse Practitioner

## 2024-09-04 DIAGNOSIS — N6002 Solitary cyst of left breast: Secondary | ICD-10-CM | POA: Diagnosis not present

## 2024-09-04 DIAGNOSIS — R928 Other abnormal and inconclusive findings on diagnostic imaging of breast: Secondary | ICD-10-CM

## 2024-09-10 DIAGNOSIS — I161 Hypertensive emergency: Secondary | ICD-10-CM | POA: Diagnosis not present

## 2024-09-10 DIAGNOSIS — H0014 Chalazion left upper eyelid: Secondary | ICD-10-CM | POA: Diagnosis not present

## 2024-12-24 ENCOUNTER — Encounter: Payer: Self-pay | Admitting: Nurse Practitioner

## 2025-02-03 ENCOUNTER — Ambulatory Visit: Payer: Self-pay
